# Patient Record
Sex: Male | Born: 1970 | Race: White | Hispanic: No | Marital: Married | Smoking: Never smoker
Health system: Southern US, Community
[De-identification: ages and names within clinical notes are randomized; demographics above are authoritative.]

## PROBLEM LIST (undated history)

## (undated) DIAGNOSIS — J45909 Unspecified asthma, uncomplicated: Secondary | ICD-10-CM

## (undated) DIAGNOSIS — R0789 Other chest pain: Secondary | ICD-10-CM

## (undated) DIAGNOSIS — E042 Nontoxic multinodular goiter: Secondary | ICD-10-CM

## (undated) DIAGNOSIS — E0590 Thyrotoxicosis, unspecified without thyrotoxic crisis or storm: Secondary | ICD-10-CM

## (undated) DIAGNOSIS — R7303 Prediabetes: Secondary | ICD-10-CM

## (undated) HISTORY — PX: LIGAMENT REPAIR: SHX5444

## (undated) HISTORY — DX: Prediabetes: R73.03

## (undated) HISTORY — DX: Unspecified asthma, uncomplicated: J45.909

## (undated) HISTORY — PX: BROSTROM PROCEDURE: SHX1270

## (undated) HISTORY — DX: Nontoxic multinodular goiter: E04.2

## (undated) HISTORY — DX: Other chest pain: R07.89

## (undated) HISTORY — DX: Thyrotoxicosis, unspecified without thyrotoxic crisis or storm: E05.90

---

## 2008-03-04 ENCOUNTER — Encounter: Admission: RE | Admit: 2008-03-04 | Discharge: 2008-03-04 | Payer: Self-pay | Admitting: Sports Medicine

## 2016-08-20 DIAGNOSIS — J3081 Allergic rhinitis due to animal (cat) (dog) hair and dander: Secondary | ICD-10-CM | POA: Diagnosis not present

## 2016-08-20 DIAGNOSIS — J301 Allergic rhinitis due to pollen: Secondary | ICD-10-CM | POA: Diagnosis not present

## 2016-08-20 DIAGNOSIS — J3089 Other allergic rhinitis: Secondary | ICD-10-CM | POA: Diagnosis not present

## 2016-09-03 DIAGNOSIS — J301 Allergic rhinitis due to pollen: Secondary | ICD-10-CM | POA: Diagnosis not present

## 2016-09-03 DIAGNOSIS — J3081 Allergic rhinitis due to animal (cat) (dog) hair and dander: Secondary | ICD-10-CM | POA: Diagnosis not present

## 2016-09-03 DIAGNOSIS — J3089 Other allergic rhinitis: Secondary | ICD-10-CM | POA: Diagnosis not present

## 2016-09-10 DIAGNOSIS — Z0000 Encounter for general adult medical examination without abnormal findings: Secondary | ICD-10-CM | POA: Diagnosis not present

## 2016-09-10 DIAGNOSIS — Z131 Encounter for screening for diabetes mellitus: Secondary | ICD-10-CM | POA: Diagnosis not present

## 2016-09-10 DIAGNOSIS — Z13220 Encounter for screening for lipoid disorders: Secondary | ICD-10-CM | POA: Diagnosis not present

## 2016-09-17 DIAGNOSIS — J3089 Other allergic rhinitis: Secondary | ICD-10-CM | POA: Diagnosis not present

## 2016-09-17 DIAGNOSIS — J301 Allergic rhinitis due to pollen: Secondary | ICD-10-CM | POA: Diagnosis not present

## 2016-09-17 DIAGNOSIS — J3081 Allergic rhinitis due to animal (cat) (dog) hair and dander: Secondary | ICD-10-CM | POA: Diagnosis not present

## 2016-10-04 DIAGNOSIS — J3081 Allergic rhinitis due to animal (cat) (dog) hair and dander: Secondary | ICD-10-CM | POA: Diagnosis not present

## 2016-10-04 DIAGNOSIS — J3089 Other allergic rhinitis: Secondary | ICD-10-CM | POA: Diagnosis not present

## 2016-10-04 DIAGNOSIS — J301 Allergic rhinitis due to pollen: Secondary | ICD-10-CM | POA: Diagnosis not present

## 2016-10-29 DIAGNOSIS — J301 Allergic rhinitis due to pollen: Secondary | ICD-10-CM | POA: Diagnosis not present

## 2016-10-29 DIAGNOSIS — J3081 Allergic rhinitis due to animal (cat) (dog) hair and dander: Secondary | ICD-10-CM | POA: Diagnosis not present

## 2016-10-29 DIAGNOSIS — J3089 Other allergic rhinitis: Secondary | ICD-10-CM | POA: Diagnosis not present

## 2016-11-14 DIAGNOSIS — J3081 Allergic rhinitis due to animal (cat) (dog) hair and dander: Secondary | ICD-10-CM | POA: Diagnosis not present

## 2016-11-14 DIAGNOSIS — J3089 Other allergic rhinitis: Secondary | ICD-10-CM | POA: Diagnosis not present

## 2016-11-14 DIAGNOSIS — J301 Allergic rhinitis due to pollen: Secondary | ICD-10-CM | POA: Diagnosis not present

## 2016-11-26 DIAGNOSIS — J3081 Allergic rhinitis due to animal (cat) (dog) hair and dander: Secondary | ICD-10-CM | POA: Diagnosis not present

## 2016-11-26 DIAGNOSIS — J3089 Other allergic rhinitis: Secondary | ICD-10-CM | POA: Diagnosis not present

## 2016-11-26 DIAGNOSIS — J301 Allergic rhinitis due to pollen: Secondary | ICD-10-CM | POA: Diagnosis not present

## 2016-12-10 DIAGNOSIS — J3081 Allergic rhinitis due to animal (cat) (dog) hair and dander: Secondary | ICD-10-CM | POA: Diagnosis not present

## 2016-12-10 DIAGNOSIS — J3089 Other allergic rhinitis: Secondary | ICD-10-CM | POA: Diagnosis not present

## 2016-12-10 DIAGNOSIS — J301 Allergic rhinitis due to pollen: Secondary | ICD-10-CM | POA: Diagnosis not present

## 2016-12-18 DIAGNOSIS — J3081 Allergic rhinitis due to animal (cat) (dog) hair and dander: Secondary | ICD-10-CM | POA: Diagnosis not present

## 2016-12-18 DIAGNOSIS — J301 Allergic rhinitis due to pollen: Secondary | ICD-10-CM | POA: Diagnosis not present

## 2016-12-18 DIAGNOSIS — J3089 Other allergic rhinitis: Secondary | ICD-10-CM | POA: Diagnosis not present

## 2016-12-24 DIAGNOSIS — J301 Allergic rhinitis due to pollen: Secondary | ICD-10-CM | POA: Diagnosis not present

## 2016-12-24 DIAGNOSIS — J3081 Allergic rhinitis due to animal (cat) (dog) hair and dander: Secondary | ICD-10-CM | POA: Diagnosis not present

## 2016-12-24 DIAGNOSIS — J3089 Other allergic rhinitis: Secondary | ICD-10-CM | POA: Diagnosis not present

## 2017-01-09 DIAGNOSIS — J301 Allergic rhinitis due to pollen: Secondary | ICD-10-CM | POA: Diagnosis not present

## 2017-01-09 DIAGNOSIS — J3081 Allergic rhinitis due to animal (cat) (dog) hair and dander: Secondary | ICD-10-CM | POA: Diagnosis not present

## 2017-01-09 DIAGNOSIS — J3089 Other allergic rhinitis: Secondary | ICD-10-CM | POA: Diagnosis not present

## 2017-01-21 DIAGNOSIS — J3081 Allergic rhinitis due to animal (cat) (dog) hair and dander: Secondary | ICD-10-CM | POA: Diagnosis not present

## 2017-01-21 DIAGNOSIS — J3089 Other allergic rhinitis: Secondary | ICD-10-CM | POA: Diagnosis not present

## 2017-01-21 DIAGNOSIS — J301 Allergic rhinitis due to pollen: Secondary | ICD-10-CM | POA: Diagnosis not present

## 2017-01-26 DIAGNOSIS — A0839 Other viral enteritis: Secondary | ICD-10-CM | POA: Diagnosis not present

## 2017-02-04 DIAGNOSIS — J3089 Other allergic rhinitis: Secondary | ICD-10-CM | POA: Diagnosis not present

## 2017-02-04 DIAGNOSIS — J301 Allergic rhinitis due to pollen: Secondary | ICD-10-CM | POA: Diagnosis not present

## 2017-02-04 DIAGNOSIS — J3081 Allergic rhinitis due to animal (cat) (dog) hair and dander: Secondary | ICD-10-CM | POA: Diagnosis not present

## 2017-02-18 DIAGNOSIS — J301 Allergic rhinitis due to pollen: Secondary | ICD-10-CM | POA: Diagnosis not present

## 2017-02-18 DIAGNOSIS — J3081 Allergic rhinitis due to animal (cat) (dog) hair and dander: Secondary | ICD-10-CM | POA: Diagnosis not present

## 2017-02-18 DIAGNOSIS — J3089 Other allergic rhinitis: Secondary | ICD-10-CM | POA: Diagnosis not present

## 2017-03-11 DIAGNOSIS — J3081 Allergic rhinitis due to animal (cat) (dog) hair and dander: Secondary | ICD-10-CM | POA: Diagnosis not present

## 2017-03-11 DIAGNOSIS — J3089 Other allergic rhinitis: Secondary | ICD-10-CM | POA: Diagnosis not present

## 2017-03-11 DIAGNOSIS — J301 Allergic rhinitis due to pollen: Secondary | ICD-10-CM | POA: Diagnosis not present

## 2017-03-25 DIAGNOSIS — J301 Allergic rhinitis due to pollen: Secondary | ICD-10-CM | POA: Diagnosis not present

## 2017-03-25 DIAGNOSIS — J3081 Allergic rhinitis due to animal (cat) (dog) hair and dander: Secondary | ICD-10-CM | POA: Diagnosis not present

## 2017-03-25 DIAGNOSIS — J3089 Other allergic rhinitis: Secondary | ICD-10-CM | POA: Diagnosis not present

## 2017-04-16 DIAGNOSIS — J3081 Allergic rhinitis due to animal (cat) (dog) hair and dander: Secondary | ICD-10-CM | POA: Diagnosis not present

## 2017-04-16 DIAGNOSIS — J301 Allergic rhinitis due to pollen: Secondary | ICD-10-CM | POA: Diagnosis not present

## 2017-04-16 DIAGNOSIS — J3089 Other allergic rhinitis: Secondary | ICD-10-CM | POA: Diagnosis not present

## 2017-04-29 DIAGNOSIS — J301 Allergic rhinitis due to pollen: Secondary | ICD-10-CM | POA: Diagnosis not present

## 2017-04-29 DIAGNOSIS — J3081 Allergic rhinitis due to animal (cat) (dog) hair and dander: Secondary | ICD-10-CM | POA: Diagnosis not present

## 2017-04-29 DIAGNOSIS — J3089 Other allergic rhinitis: Secondary | ICD-10-CM | POA: Diagnosis not present

## 2017-05-13 DIAGNOSIS — J3089 Other allergic rhinitis: Secondary | ICD-10-CM | POA: Diagnosis not present

## 2017-05-13 DIAGNOSIS — Z23 Encounter for immunization: Secondary | ICD-10-CM | POA: Diagnosis not present

## 2017-05-13 DIAGNOSIS — R599 Enlarged lymph nodes, unspecified: Secondary | ICD-10-CM | POA: Diagnosis not present

## 2017-05-13 DIAGNOSIS — L237 Allergic contact dermatitis due to plants, except food: Secondary | ICD-10-CM | POA: Diagnosis not present

## 2017-05-13 DIAGNOSIS — J3081 Allergic rhinitis due to animal (cat) (dog) hair and dander: Secondary | ICD-10-CM | POA: Diagnosis not present

## 2017-05-13 DIAGNOSIS — J301 Allergic rhinitis due to pollen: Secondary | ICD-10-CM | POA: Diagnosis not present

## 2017-05-17 ENCOUNTER — Other Ambulatory Visit: Payer: Self-pay | Admitting: Physician Assistant

## 2017-05-17 DIAGNOSIS — E0590 Thyrotoxicosis, unspecified without thyrotoxic crisis or storm: Secondary | ICD-10-CM | POA: Diagnosis not present

## 2017-05-17 DIAGNOSIS — R599 Enlarged lymph nodes, unspecified: Secondary | ICD-10-CM

## 2017-05-20 ENCOUNTER — Ambulatory Visit
Admission: RE | Admit: 2017-05-20 | Discharge: 2017-05-20 | Disposition: A | Discharge status: Home / Self Care | Payer: 59 | Source: Ambulatory Visit | Attending: Physician Assistant | Admitting: Physician Assistant

## 2017-05-20 DIAGNOSIS — R599 Enlarged lymph nodes, unspecified: Secondary | ICD-10-CM

## 2017-05-20 DIAGNOSIS — E042 Nontoxic multinodular goiter: Secondary | ICD-10-CM | POA: Diagnosis not present

## 2017-05-23 ENCOUNTER — Other Ambulatory Visit: Payer: Self-pay | Admitting: Physician Assistant

## 2017-05-23 DIAGNOSIS — R599 Enlarged lymph nodes, unspecified: Secondary | ICD-10-CM

## 2017-05-27 DIAGNOSIS — J3081 Allergic rhinitis due to animal (cat) (dog) hair and dander: Secondary | ICD-10-CM | POA: Diagnosis not present

## 2017-05-27 DIAGNOSIS — J301 Allergic rhinitis due to pollen: Secondary | ICD-10-CM | POA: Diagnosis not present

## 2017-05-27 DIAGNOSIS — J3089 Other allergic rhinitis: Secondary | ICD-10-CM | POA: Diagnosis not present

## 2017-05-28 DIAGNOSIS — J3081 Allergic rhinitis due to animal (cat) (dog) hair and dander: Secondary | ICD-10-CM | POA: Diagnosis not present

## 2017-05-28 DIAGNOSIS — J301 Allergic rhinitis due to pollen: Secondary | ICD-10-CM | POA: Diagnosis not present

## 2017-05-28 DIAGNOSIS — J3089 Other allergic rhinitis: Secondary | ICD-10-CM | POA: Diagnosis not present

## 2017-06-05 ENCOUNTER — Ambulatory Visit
Admission: RE | Admit: 2017-06-05 | Discharge: 2017-06-05 | Disposition: A | Discharge status: Home / Self Care | Payer: 59 | Source: Ambulatory Visit | Attending: Physician Assistant | Admitting: Physician Assistant

## 2017-06-05 ENCOUNTER — Other Ambulatory Visit: Payer: Self-pay | Admitting: Physician Assistant

## 2017-06-05 ENCOUNTER — Other Ambulatory Visit (HOSPITAL_COMMUNITY)
Admission: RE | Admit: 2017-06-05 | Discharge: 2017-06-05 | Disposition: A | Discharge status: Home / Self Care | Payer: 59 | Source: Ambulatory Visit | Attending: Radiology | Admitting: Radiology

## 2017-06-05 DIAGNOSIS — E042 Nontoxic multinodular goiter: Secondary | ICD-10-CM | POA: Diagnosis not present

## 2017-06-05 DIAGNOSIS — E041 Nontoxic single thyroid nodule: Secondary | ICD-10-CM

## 2017-06-05 DIAGNOSIS — R599 Enlarged lymph nodes, unspecified: Secondary | ICD-10-CM

## 2017-06-11 DIAGNOSIS — J3081 Allergic rhinitis due to animal (cat) (dog) hair and dander: Secondary | ICD-10-CM | POA: Diagnosis not present

## 2017-06-11 DIAGNOSIS — J3089 Other allergic rhinitis: Secondary | ICD-10-CM | POA: Diagnosis not present

## 2017-06-11 DIAGNOSIS — J301 Allergic rhinitis due to pollen: Secondary | ICD-10-CM | POA: Diagnosis not present

## 2017-06-24 DIAGNOSIS — J3089 Other allergic rhinitis: Secondary | ICD-10-CM | POA: Diagnosis not present

## 2017-06-24 DIAGNOSIS — J301 Allergic rhinitis due to pollen: Secondary | ICD-10-CM | POA: Diagnosis not present

## 2017-06-24 DIAGNOSIS — J3081 Allergic rhinitis due to animal (cat) (dog) hair and dander: Secondary | ICD-10-CM | POA: Diagnosis not present

## 2017-07-01 DIAGNOSIS — J301 Allergic rhinitis due to pollen: Secondary | ICD-10-CM | POA: Diagnosis not present

## 2017-07-01 DIAGNOSIS — J3089 Other allergic rhinitis: Secondary | ICD-10-CM | POA: Diagnosis not present

## 2017-07-01 DIAGNOSIS — J3081 Allergic rhinitis due to animal (cat) (dog) hair and dander: Secondary | ICD-10-CM | POA: Diagnosis not present

## 2017-07-02 DIAGNOSIS — E042 Nontoxic multinodular goiter: Secondary | ICD-10-CM | POA: Diagnosis not present

## 2017-07-02 DIAGNOSIS — R946 Abnormal results of thyroid function studies: Secondary | ICD-10-CM | POA: Diagnosis not present

## 2017-07-02 DIAGNOSIS — R9389 Abnormal findings on diagnostic imaging of other specified body structures: Secondary | ICD-10-CM | POA: Diagnosis not present

## 2017-07-02 DIAGNOSIS — Z808 Family history of malignant neoplasm of other organs or systems: Secondary | ICD-10-CM | POA: Diagnosis not present

## 2017-07-15 DIAGNOSIS — J3089 Other allergic rhinitis: Secondary | ICD-10-CM | POA: Diagnosis not present

## 2017-07-15 DIAGNOSIS — J301 Allergic rhinitis due to pollen: Secondary | ICD-10-CM | POA: Diagnosis not present

## 2017-07-15 DIAGNOSIS — J3081 Allergic rhinitis due to animal (cat) (dog) hair and dander: Secondary | ICD-10-CM | POA: Diagnosis not present

## 2017-07-29 DIAGNOSIS — J301 Allergic rhinitis due to pollen: Secondary | ICD-10-CM | POA: Diagnosis not present

## 2017-07-29 DIAGNOSIS — J3089 Other allergic rhinitis: Secondary | ICD-10-CM | POA: Diagnosis not present

## 2017-07-29 DIAGNOSIS — J3081 Allergic rhinitis due to animal (cat) (dog) hair and dander: Secondary | ICD-10-CM | POA: Diagnosis not present

## 2017-08-19 DIAGNOSIS — J3089 Other allergic rhinitis: Secondary | ICD-10-CM | POA: Diagnosis not present

## 2017-08-19 DIAGNOSIS — J3081 Allergic rhinitis due to animal (cat) (dog) hair and dander: Secondary | ICD-10-CM | POA: Diagnosis not present

## 2017-08-19 DIAGNOSIS — J301 Allergic rhinitis due to pollen: Secondary | ICD-10-CM | POA: Diagnosis not present

## 2017-09-09 DIAGNOSIS — J3089 Other allergic rhinitis: Secondary | ICD-10-CM | POA: Diagnosis not present

## 2017-09-09 DIAGNOSIS — J3081 Allergic rhinitis due to animal (cat) (dog) hair and dander: Secondary | ICD-10-CM | POA: Diagnosis not present

## 2017-09-09 DIAGNOSIS — J301 Allergic rhinitis due to pollen: Secondary | ICD-10-CM | POA: Diagnosis not present

## 2017-09-18 DIAGNOSIS — Z0000 Encounter for general adult medical examination without abnormal findings: Secondary | ICD-10-CM | POA: Diagnosis not present

## 2017-09-18 DIAGNOSIS — E7800 Pure hypercholesterolemia, unspecified: Secondary | ICD-10-CM | POA: Diagnosis not present

## 2017-09-18 DIAGNOSIS — Z23 Encounter for immunization: Secondary | ICD-10-CM | POA: Diagnosis not present

## 2017-09-30 DIAGNOSIS — J301 Allergic rhinitis due to pollen: Secondary | ICD-10-CM | POA: Diagnosis not present

## 2017-09-30 DIAGNOSIS — J3081 Allergic rhinitis due to animal (cat) (dog) hair and dander: Secondary | ICD-10-CM | POA: Diagnosis not present

## 2017-09-30 DIAGNOSIS — J3089 Other allergic rhinitis: Secondary | ICD-10-CM | POA: Diagnosis not present

## 2017-10-21 DIAGNOSIS — J301 Allergic rhinitis due to pollen: Secondary | ICD-10-CM | POA: Diagnosis not present

## 2017-10-21 DIAGNOSIS — J3089 Other allergic rhinitis: Secondary | ICD-10-CM | POA: Diagnosis not present

## 2017-10-21 DIAGNOSIS — J3081 Allergic rhinitis due to animal (cat) (dog) hair and dander: Secondary | ICD-10-CM | POA: Diagnosis not present

## 2017-10-22 DIAGNOSIS — J3081 Allergic rhinitis due to animal (cat) (dog) hair and dander: Secondary | ICD-10-CM | POA: Diagnosis not present

## 2017-10-22 DIAGNOSIS — J301 Allergic rhinitis due to pollen: Secondary | ICD-10-CM | POA: Diagnosis not present

## 2017-10-22 DIAGNOSIS — J3089 Other allergic rhinitis: Secondary | ICD-10-CM | POA: Diagnosis not present

## 2017-11-19 DIAGNOSIS — J3081 Allergic rhinitis due to animal (cat) (dog) hair and dander: Secondary | ICD-10-CM | POA: Diagnosis not present

## 2017-11-19 DIAGNOSIS — J301 Allergic rhinitis due to pollen: Secondary | ICD-10-CM | POA: Diagnosis not present

## 2017-11-19 DIAGNOSIS — J3089 Other allergic rhinitis: Secondary | ICD-10-CM | POA: Diagnosis not present

## 2017-12-11 DIAGNOSIS — J301 Allergic rhinitis due to pollen: Secondary | ICD-10-CM | POA: Diagnosis not present

## 2017-12-11 DIAGNOSIS — J3089 Other allergic rhinitis: Secondary | ICD-10-CM | POA: Diagnosis not present

## 2017-12-11 DIAGNOSIS — J3081 Allergic rhinitis due to animal (cat) (dog) hair and dander: Secondary | ICD-10-CM | POA: Diagnosis not present

## 2017-12-18 ENCOUNTER — Other Ambulatory Visit: Payer: Self-pay | Admitting: Internal Medicine

## 2017-12-18 DIAGNOSIS — E042 Nontoxic multinodular goiter: Secondary | ICD-10-CM

## 2017-12-18 DIAGNOSIS — Z808 Family history of malignant neoplasm of other organs or systems: Secondary | ICD-10-CM | POA: Diagnosis not present

## 2017-12-18 DIAGNOSIS — R946 Abnormal results of thyroid function studies: Secondary | ICD-10-CM | POA: Diagnosis not present

## 2017-12-30 DIAGNOSIS — J301 Allergic rhinitis due to pollen: Secondary | ICD-10-CM | POA: Diagnosis not present

## 2017-12-30 DIAGNOSIS — J3089 Other allergic rhinitis: Secondary | ICD-10-CM | POA: Diagnosis not present

## 2017-12-30 DIAGNOSIS — J3081 Allergic rhinitis due to animal (cat) (dog) hair and dander: Secondary | ICD-10-CM | POA: Diagnosis not present

## 2018-01-27 DIAGNOSIS — J3081 Allergic rhinitis due to animal (cat) (dog) hair and dander: Secondary | ICD-10-CM | POA: Diagnosis not present

## 2018-01-27 DIAGNOSIS — J3089 Other allergic rhinitis: Secondary | ICD-10-CM | POA: Diagnosis not present

## 2018-01-27 DIAGNOSIS — J301 Allergic rhinitis due to pollen: Secondary | ICD-10-CM | POA: Diagnosis not present

## 2018-02-24 DIAGNOSIS — J301 Allergic rhinitis due to pollen: Secondary | ICD-10-CM | POA: Diagnosis not present

## 2018-02-24 DIAGNOSIS — J3081 Allergic rhinitis due to animal (cat) (dog) hair and dander: Secondary | ICD-10-CM | POA: Diagnosis not present

## 2018-02-24 DIAGNOSIS — J3089 Other allergic rhinitis: Secondary | ICD-10-CM | POA: Diagnosis not present

## 2018-03-31 DIAGNOSIS — J301 Allergic rhinitis due to pollen: Secondary | ICD-10-CM | POA: Diagnosis not present

## 2018-03-31 DIAGNOSIS — J3081 Allergic rhinitis due to animal (cat) (dog) hair and dander: Secondary | ICD-10-CM | POA: Diagnosis not present

## 2018-03-31 DIAGNOSIS — J3089 Other allergic rhinitis: Secondary | ICD-10-CM | POA: Diagnosis not present

## 2018-04-23 DIAGNOSIS — J3081 Allergic rhinitis due to animal (cat) (dog) hair and dander: Secondary | ICD-10-CM | POA: Diagnosis not present

## 2018-04-23 DIAGNOSIS — J301 Allergic rhinitis due to pollen: Secondary | ICD-10-CM | POA: Diagnosis not present

## 2018-04-23 DIAGNOSIS — J3089 Other allergic rhinitis: Secondary | ICD-10-CM | POA: Diagnosis not present

## 2018-05-14 ENCOUNTER — Other Ambulatory Visit: Payer: 59

## 2018-05-16 ENCOUNTER — Ambulatory Visit
Admission: RE | Admit: 2018-05-16 | Discharge: 2018-05-16 | Disposition: A | Discharge status: Home / Self Care | Payer: 59 | Source: Ambulatory Visit | Attending: Internal Medicine | Admitting: Internal Medicine

## 2018-05-16 DIAGNOSIS — E042 Nontoxic multinodular goiter: Secondary | ICD-10-CM

## 2018-05-26 DIAGNOSIS — J3081 Allergic rhinitis due to animal (cat) (dog) hair and dander: Secondary | ICD-10-CM | POA: Diagnosis not present

## 2018-05-26 DIAGNOSIS — J301 Allergic rhinitis due to pollen: Secondary | ICD-10-CM | POA: Diagnosis not present

## 2018-05-26 DIAGNOSIS — J3089 Other allergic rhinitis: Secondary | ICD-10-CM | POA: Diagnosis not present

## 2018-06-16 DIAGNOSIS — E042 Nontoxic multinodular goiter: Secondary | ICD-10-CM | POA: Diagnosis not present

## 2018-06-18 ENCOUNTER — Other Ambulatory Visit (HOSPITAL_COMMUNITY): Payer: Self-pay | Admitting: Internal Medicine

## 2018-06-18 DIAGNOSIS — R946 Abnormal results of thyroid function studies: Secondary | ICD-10-CM | POA: Diagnosis not present

## 2018-06-18 DIAGNOSIS — R9389 Abnormal findings on diagnostic imaging of other specified body structures: Secondary | ICD-10-CM | POA: Diagnosis not present

## 2018-06-18 DIAGNOSIS — E042 Nontoxic multinodular goiter: Secondary | ICD-10-CM | POA: Diagnosis not present

## 2018-06-23 DIAGNOSIS — J3081 Allergic rhinitis due to animal (cat) (dog) hair and dander: Secondary | ICD-10-CM | POA: Diagnosis not present

## 2018-06-23 DIAGNOSIS — J301 Allergic rhinitis due to pollen: Secondary | ICD-10-CM | POA: Diagnosis not present

## 2018-06-23 DIAGNOSIS — J3089 Other allergic rhinitis: Secondary | ICD-10-CM | POA: Diagnosis not present

## 2018-06-27 DIAGNOSIS — E042 Nontoxic multinodular goiter: Secondary | ICD-10-CM | POA: Diagnosis not present

## 2018-07-01 ENCOUNTER — Encounter (HOSPITAL_COMMUNITY): Payer: 59

## 2018-07-02 ENCOUNTER — Encounter (HOSPITAL_COMMUNITY): Payer: 59

## 2018-07-07 ENCOUNTER — Encounter (HOSPITAL_COMMUNITY)
Admission: RE | Admit: 2018-07-07 | Discharge: 2018-07-07 | Disposition: A | Discharge status: Home / Self Care | Payer: 59 | Source: Ambulatory Visit | Attending: Internal Medicine | Admitting: Internal Medicine

## 2018-07-07 DIAGNOSIS — E042 Nontoxic multinodular goiter: Secondary | ICD-10-CM | POA: Diagnosis not present

## 2018-07-07 MED ORDER — SODIUM IODIDE I-123 7.4 MBQ CAPS
466.0000 | ORAL_CAPSULE | Freq: Once | ORAL | Status: AC
  Administered 2018-07-07: 466 via ORAL

## 2018-07-08 ENCOUNTER — Encounter (HOSPITAL_COMMUNITY)
Admission: RE | Admit: 2018-07-08 | Discharge: 2018-07-08 | Disposition: A | Discharge status: Home / Self Care | Payer: 59 | Source: Ambulatory Visit | Attending: Internal Medicine | Admitting: Internal Medicine

## 2018-07-08 DIAGNOSIS — E0590 Thyrotoxicosis, unspecified without thyrotoxic crisis or storm: Secondary | ICD-10-CM | POA: Diagnosis not present

## 2018-07-21 DIAGNOSIS — J3081 Allergic rhinitis due to animal (cat) (dog) hair and dander: Secondary | ICD-10-CM | POA: Diagnosis not present

## 2018-07-21 DIAGNOSIS — J301 Allergic rhinitis due to pollen: Secondary | ICD-10-CM | POA: Diagnosis not present

## 2018-07-21 DIAGNOSIS — J3089 Other allergic rhinitis: Secondary | ICD-10-CM | POA: Diagnosis not present

## 2018-08-18 DIAGNOSIS — J3081 Allergic rhinitis due to animal (cat) (dog) hair and dander: Secondary | ICD-10-CM | POA: Diagnosis not present

## 2018-08-18 DIAGNOSIS — J3089 Other allergic rhinitis: Secondary | ICD-10-CM | POA: Diagnosis not present

## 2018-08-18 DIAGNOSIS — J301 Allergic rhinitis due to pollen: Secondary | ICD-10-CM | POA: Diagnosis not present

## 2018-09-15 DIAGNOSIS — J3089 Other allergic rhinitis: Secondary | ICD-10-CM | POA: Diagnosis not present

## 2018-09-15 DIAGNOSIS — J3081 Allergic rhinitis due to animal (cat) (dog) hair and dander: Secondary | ICD-10-CM | POA: Diagnosis not present

## 2018-09-15 DIAGNOSIS — J301 Allergic rhinitis due to pollen: Secondary | ICD-10-CM | POA: Diagnosis not present

## 2018-09-21 DIAGNOSIS — J301 Allergic rhinitis due to pollen: Secondary | ICD-10-CM | POA: Diagnosis not present

## 2018-09-21 DIAGNOSIS — J3081 Allergic rhinitis due to animal (cat) (dog) hair and dander: Secondary | ICD-10-CM | POA: Diagnosis not present

## 2018-09-21 DIAGNOSIS — J3089 Other allergic rhinitis: Secondary | ICD-10-CM | POA: Diagnosis not present

## 2018-09-24 DIAGNOSIS — Z136 Encounter for screening for cardiovascular disorders: Secondary | ICD-10-CM | POA: Diagnosis not present

## 2018-09-24 DIAGNOSIS — Z0000 Encounter for general adult medical examination without abnormal findings: Secondary | ICD-10-CM | POA: Diagnosis not present

## 2018-10-03 DIAGNOSIS — R946 Abnormal results of thyroid function studies: Secondary | ICD-10-CM | POA: Diagnosis not present

## 2018-10-03 DIAGNOSIS — Z808 Family history of malignant neoplasm of other organs or systems: Secondary | ICD-10-CM | POA: Diagnosis not present

## 2018-10-03 DIAGNOSIS — E042 Nontoxic multinodular goiter: Secondary | ICD-10-CM | POA: Diagnosis not present

## 2018-10-15 DIAGNOSIS — J3081 Allergic rhinitis due to animal (cat) (dog) hair and dander: Secondary | ICD-10-CM | POA: Diagnosis not present

## 2018-10-15 DIAGNOSIS — J3089 Other allergic rhinitis: Secondary | ICD-10-CM | POA: Diagnosis not present

## 2018-10-15 DIAGNOSIS — J301 Allergic rhinitis due to pollen: Secondary | ICD-10-CM | POA: Diagnosis not present

## 2018-11-11 DIAGNOSIS — J3081 Allergic rhinitis due to animal (cat) (dog) hair and dander: Secondary | ICD-10-CM | POA: Diagnosis not present

## 2018-11-11 DIAGNOSIS — J301 Allergic rhinitis due to pollen: Secondary | ICD-10-CM | POA: Diagnosis not present

## 2018-11-11 DIAGNOSIS — J3089 Other allergic rhinitis: Secondary | ICD-10-CM | POA: Diagnosis not present

## 2018-12-03 DIAGNOSIS — J3081 Allergic rhinitis due to animal (cat) (dog) hair and dander: Secondary | ICD-10-CM | POA: Diagnosis not present

## 2018-12-03 DIAGNOSIS — J3089 Other allergic rhinitis: Secondary | ICD-10-CM | POA: Diagnosis not present

## 2018-12-03 DIAGNOSIS — J301 Allergic rhinitis due to pollen: Secondary | ICD-10-CM | POA: Diagnosis not present

## 2018-12-09 DIAGNOSIS — J3089 Other allergic rhinitis: Secondary | ICD-10-CM | POA: Diagnosis not present

## 2018-12-09 DIAGNOSIS — J301 Allergic rhinitis due to pollen: Secondary | ICD-10-CM | POA: Diagnosis not present

## 2018-12-09 DIAGNOSIS — J3081 Allergic rhinitis due to animal (cat) (dog) hair and dander: Secondary | ICD-10-CM | POA: Diagnosis not present

## 2019-03-04 IMAGING — US US THYROID BIOPSY
1 series · 13 of 20 positions shown · non-contrast
Comparison: US Thyroid 05/20/2017

MEDICATIONS:
10 cc 1% lidocaine

COMPLICATIONS:
None immediate.

INDICATION: Indeterminate thyroid nodule

Right superior nodule; 2.4 cm
Left mid nodule; 1.5 cm
EXAM:
ULTRASOUND GUIDED FINE NEEDLE ASPIRATION OF INDETERMINATE THYROID
NODULE
TECHNIQUE: Informed written consent was obtained from the patient after a
discussion of the risks, benefits and alternatives to treatment.
Questions regarding the procedure were encouraged and answered. A
timeout was performed prior to the initiation of the procedure.

[Series 1: us thyroid biopsy · 0.06mm/px · 20 acquisitions, 13 frames shown]
[im 1/20]
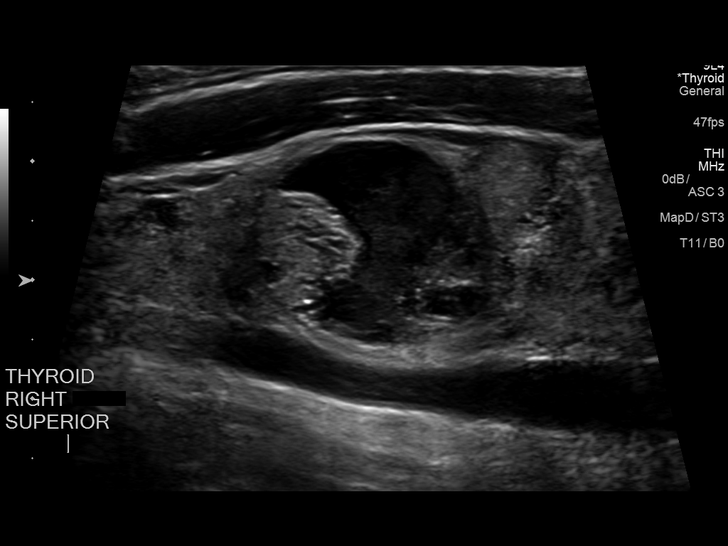
[im 3/20]
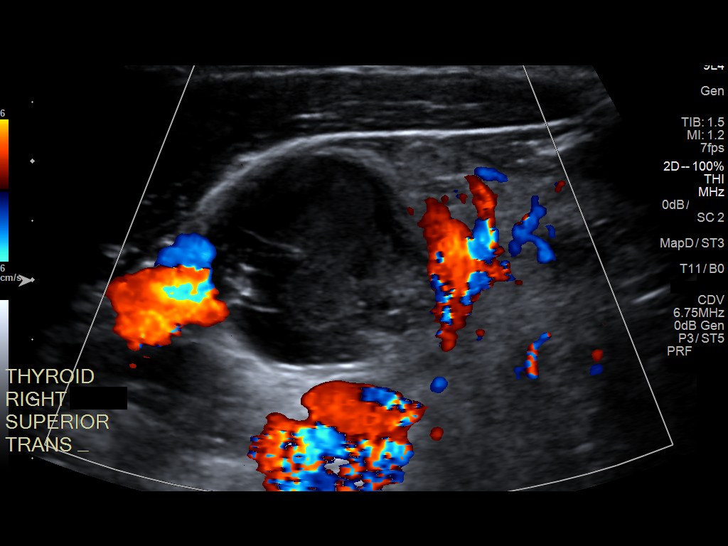
[im 4/20]
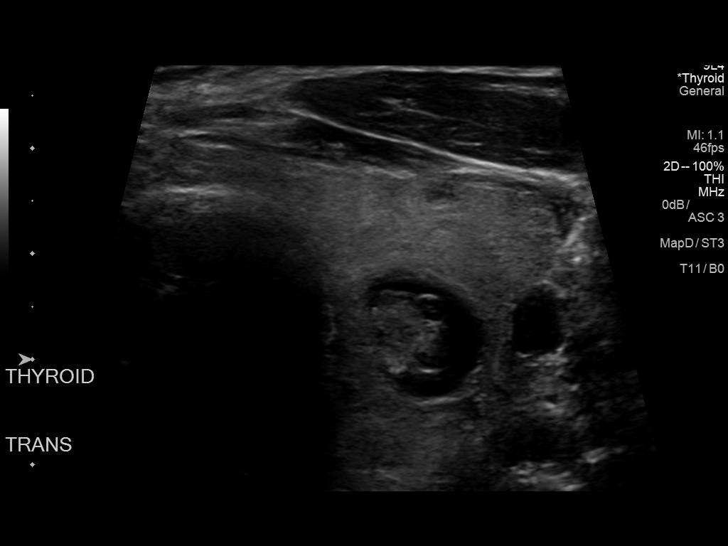
[im 6/20]
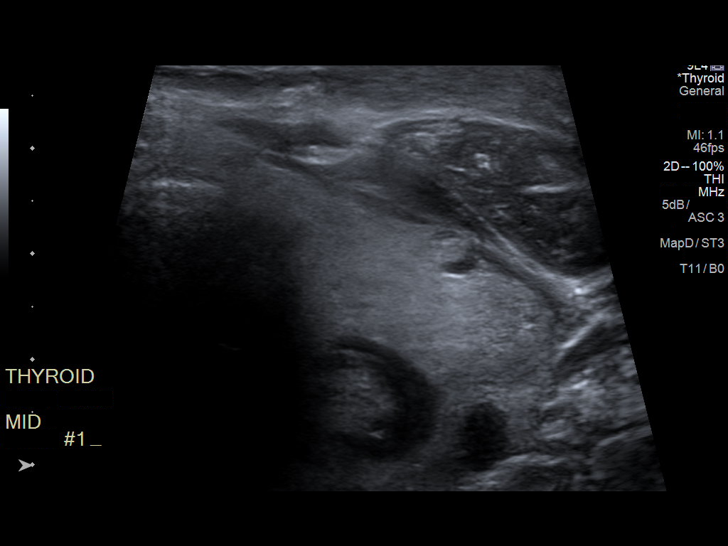
[im 7/20]
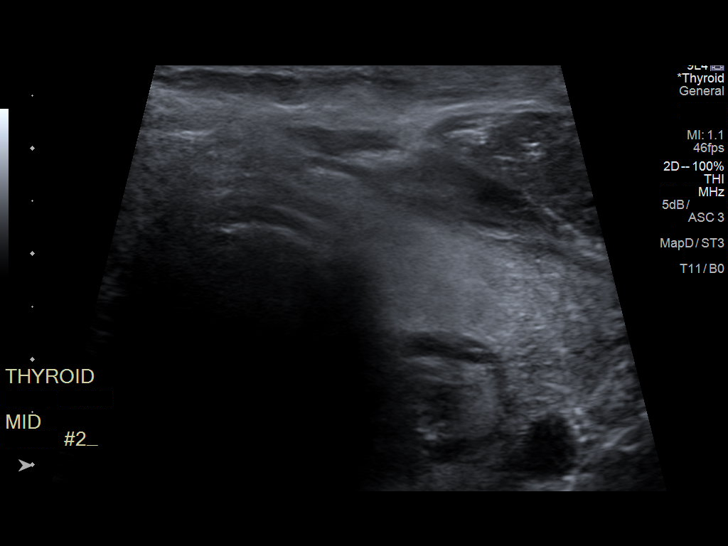
[im 9/20]
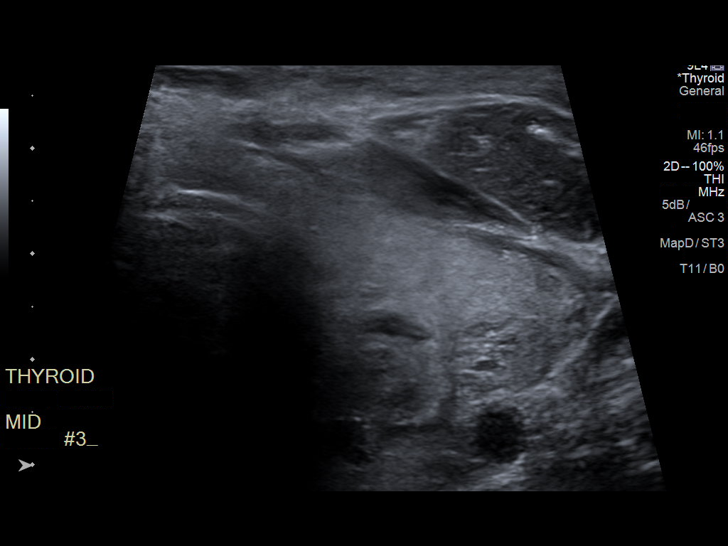
[im 11/20]
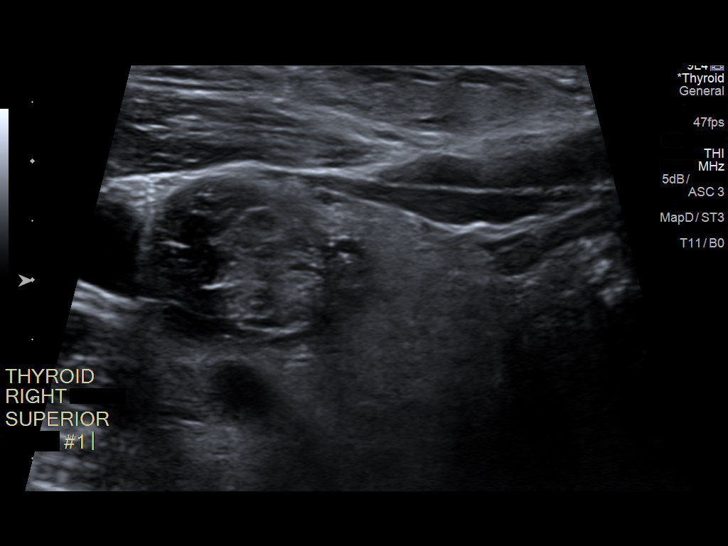
[im 12/20]
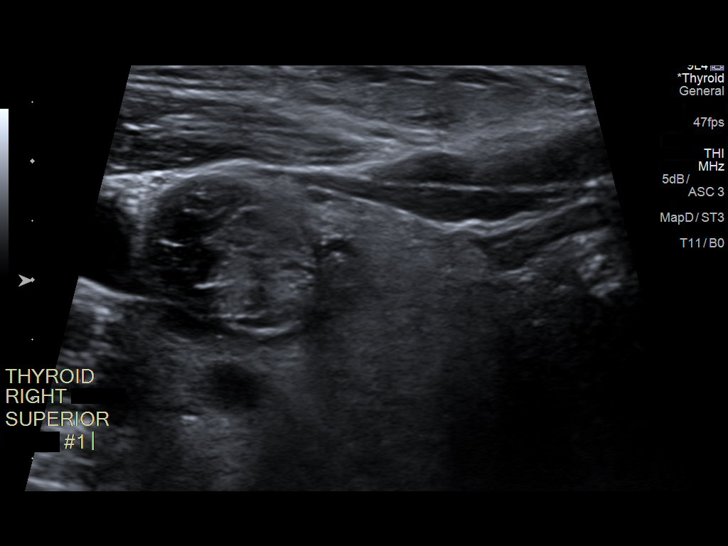
[im 14/20]
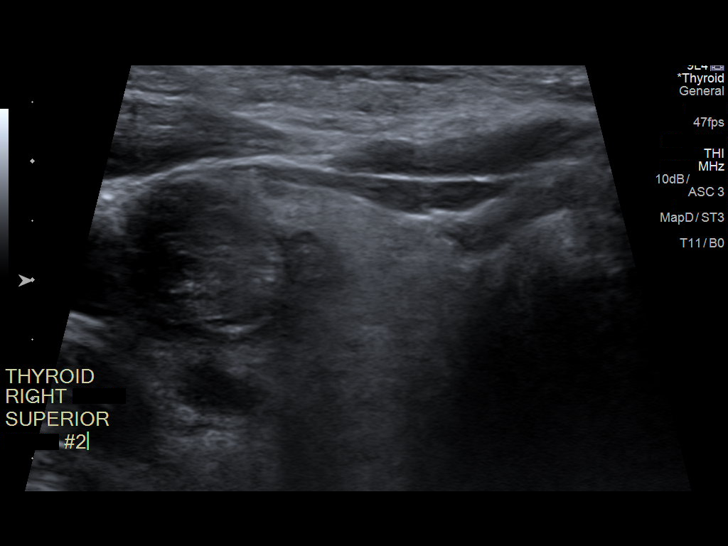
[im 15/20]
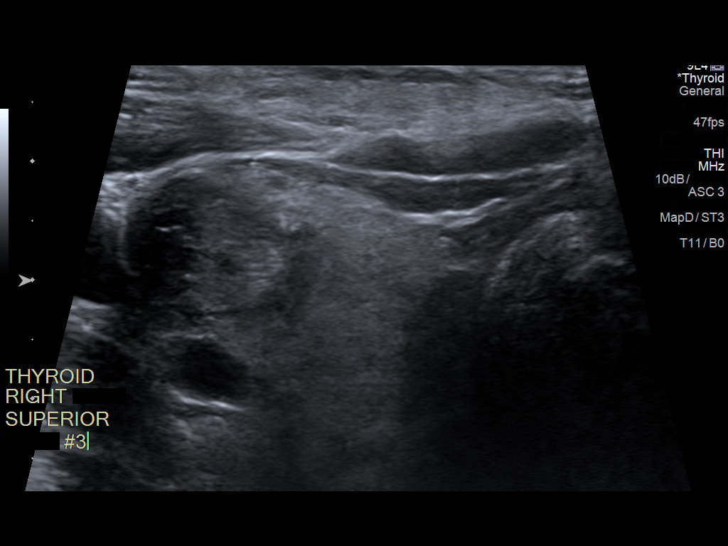
[im 17/20]
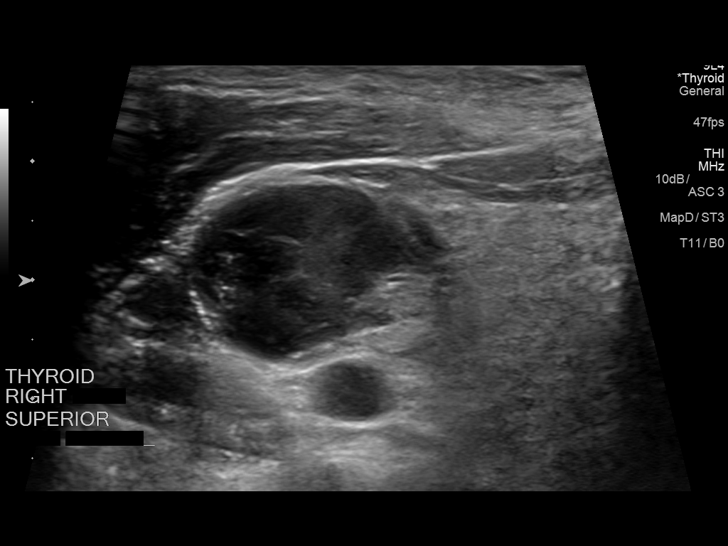
[im 18/20]
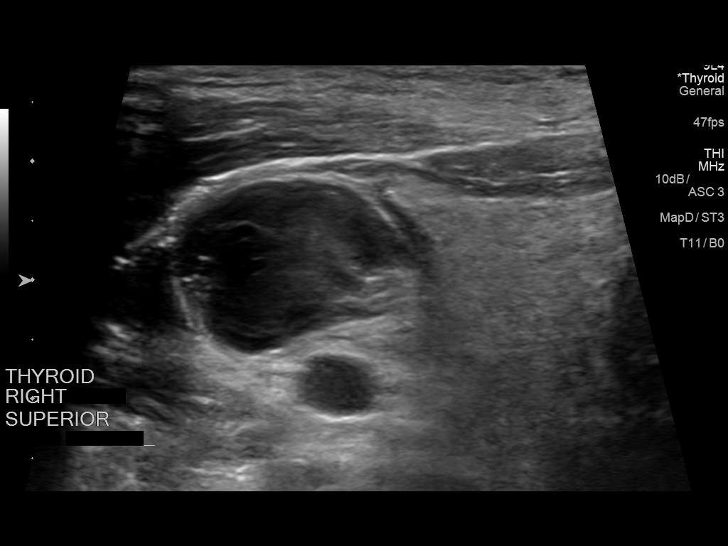
[im 20/20]
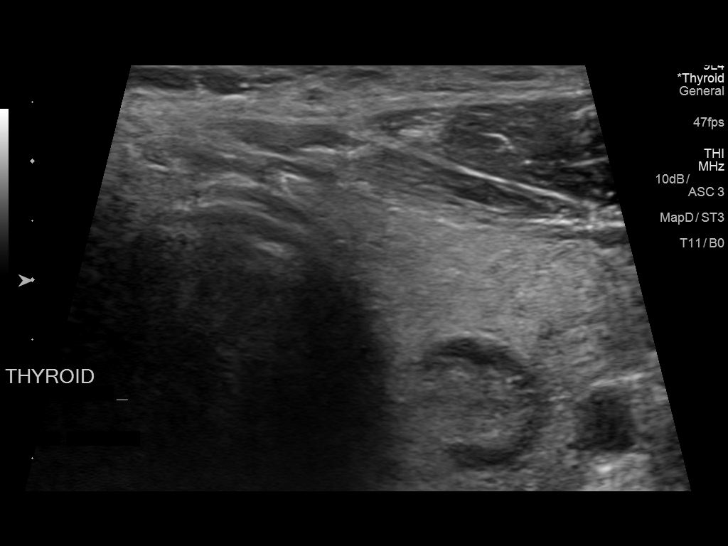

[13 of 20 positions shown; findings below may reference images not displayed]

Pre-procedural ultrasound scanning demonstrated unchanged size and
appearance of the indeterminate nodules within the right and left
thyroid

The procedure was planned. The neck was prepped in the usual sterile
fashion, and a sterile drape was applied covering the operative
field. A timeout was performed prior to the initiation of the
procedure. Local anesthesia was provided with 1% lidocaine.

Under direct ultrasound guidance, 3 FNA biopsies were performed of
the right superior thyroid nodule with a 25 gauge needle. Multiple
ultrasound images were saved for procedural documentation purposes.
The samples were prepared and submitted to pathology.

Under direct ultrasound guidance, 3 FNA biopsies were performed of
the left mid thyroid nodule with a 25 gauge needle. Multiple
ultrasound images were saved for procedural documentation purposes.
The samples were prepared and submitted to pathology.

Limited post procedural scanning was negative for hematoma or
additional complication. Dressings were placed. The patient
tolerated the above procedures procedure well without immediate
postprocedural complication.
FINDINGS: Nodule reference number based on prior diagnostic ultrasound: 1

Maximum size:  2.4 cm

Location: Right; Superior

ACR TI-RADS risk category: TR4 (4-6 points)

Reason for biopsy: meets ACR TI-RADS criteria

_________________________________________________________

Nodule reference number based on prior diagnostic ultrasound: 5

Maximum size:  1.5 cm

Location: Left; Mid

ACR TI-RADS risk category: TR4 (4-6 points)

Reason for biopsy: meets ACR TI-RADS criteria

Ultrasound imaging confirms appropriate placement of the needles
within the thyroid nodule.
IMPRESSION: 1. Technically successful ultrasound guided fine needle aspiration
of right superior thyroid nodule
2. Technically successful ultrasound guided fine needle aspiration
of left mid thyroid nodule

Read by

Viktor Neri

## 2019-12-10 IMAGING — NM NM THYROID IMAGING W/ UPTAKE MULTI (4&24 HR)
4 series · 4 of 4 positions shown · non-contrast
Comparison: Thyroid ultrasound 06/05/2017

CLINICAL DATA: Hyperthyroidism.

EXAM:
THYROID SCAN AND UPTAKE - 4 AND 24 HOURS
TECHNIQUE: Following oral administration of M-2GG capsule, anterior planar
imaging was acquired at 24 hours. Thyroid uptake was calculated with
a thyroid probe at 4-6 hours and 24 hours.
RADIOPHARMACEUTICALS:  Four hundred sixty-six uCi M-2GG sodium
iodide p.o.

[iu thyroid uptake i123 · 3.10mm/px · 1 of 1 slices shown (1 of 4)]
[im 1/1]
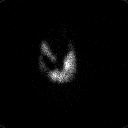

[iu thyroid uptake i123 · 3.10mm/px · 1 of 1 slices shown (2 of 4)]
[im 1/1]
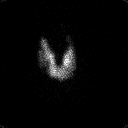

[iu thyroid uptake i123 · 3.10mm/px · 1 of 1 slices shown (3 of 4)]
[im 1/1]
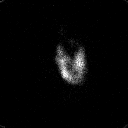

[iu thyroid uptake i123 · 3.10mm/px · 1 of 1 slices shown (4 of 4)]
[im 1/1]
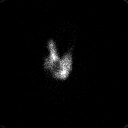

[4 of 4 positions shown; findings below may reference images not displayed]

FINDINGS: There is a large photopenic defect within the right lobe of thyroid
gland corresponding to previously biopsied nodule. No additional
hilar cold nodules identified..

4 hour M-2GG uptake = 8.1% (normal 5-20%)

24 hour M-2GG uptake = 18.4% (normal 10-30%)
IMPRESSION: Normal 4 hour and 24 hour radioactive iodine uptake.

Cold nodule in right lobe of thyroid gland corresponds to recently
biopsied nodule with benign histology.

Please select correct template:

## 2020-02-12 IMAGING — US US THYROID
1 series · 12 of 25 positions shown · non-contrast
Comparison: 05/20/2017

CLINICAL DATA: Goiter. Family history of thyroid cancer. Prior
biopsy.

EXAM:
THYROID ULTRASOUND
TECHNIQUE: Ultrasound examination of the thyroid gland and adjacent soft
tissues was performed.

[Series 1: us thyroid · 0.06mm/px · 91 acquisitions, 12 frames shown]
[im 4/91]
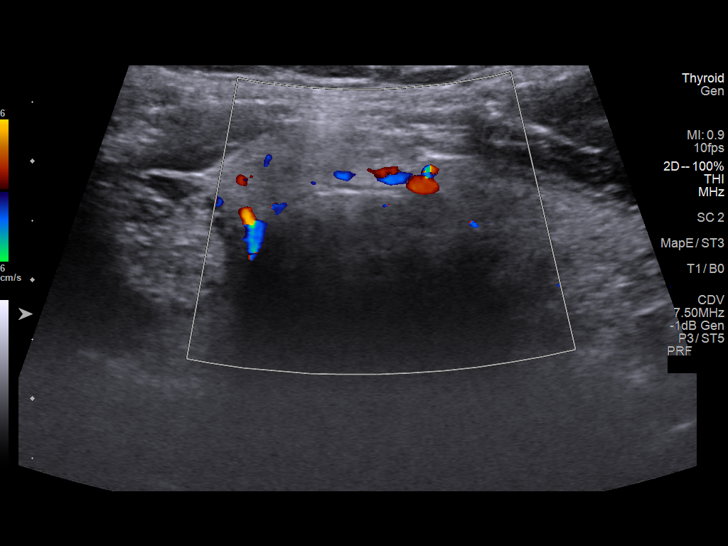
[im 12/91]
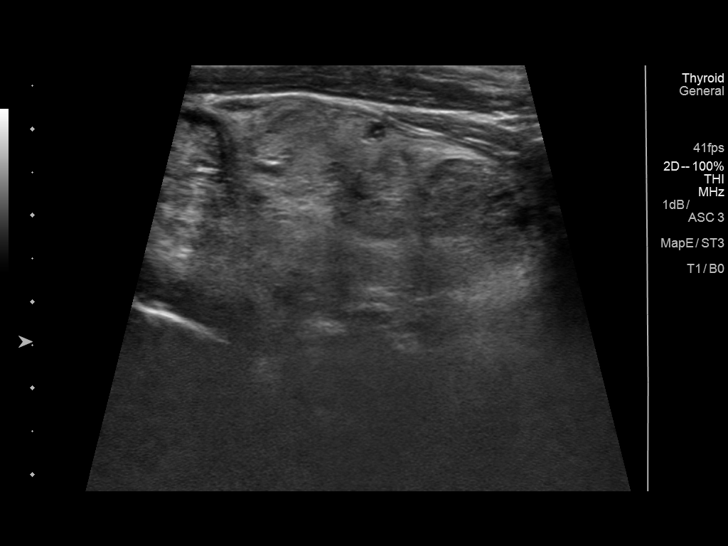
[im 19/91]
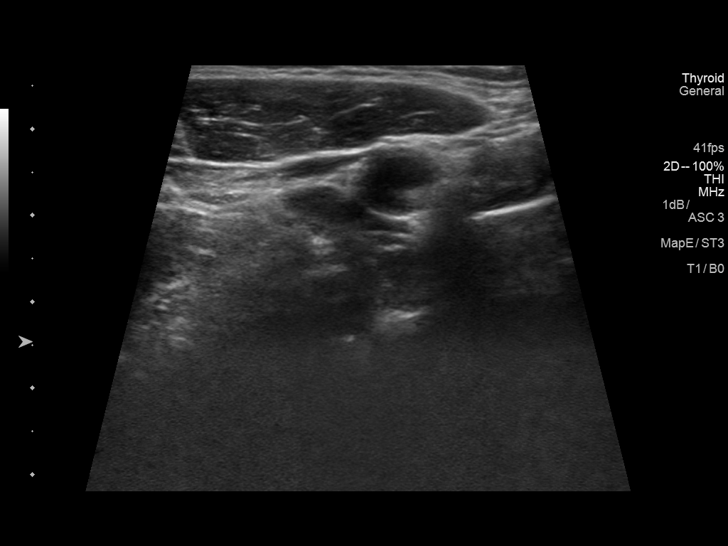
[im 27/91]
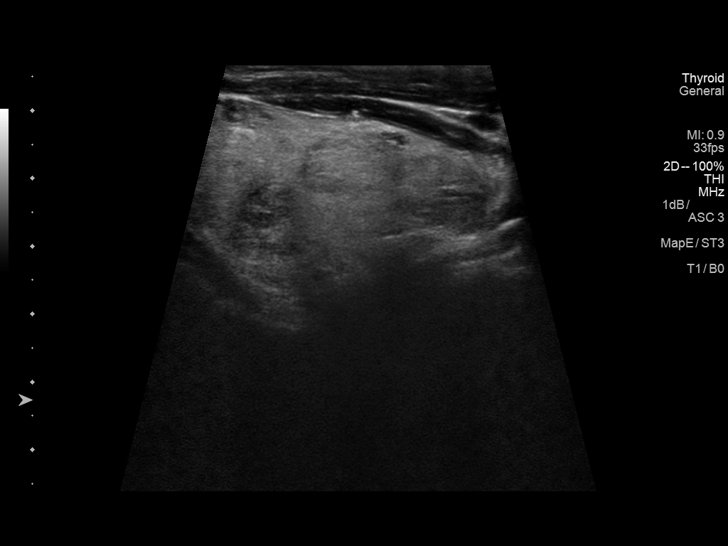
[im 34/91]
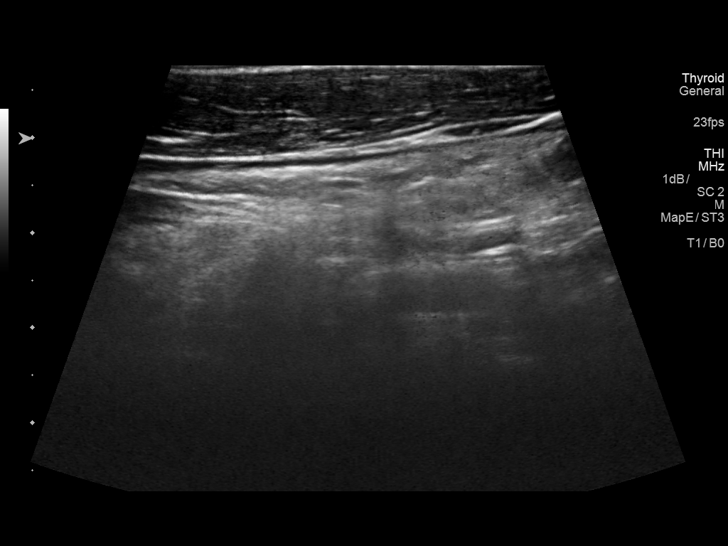
[im 42/91]
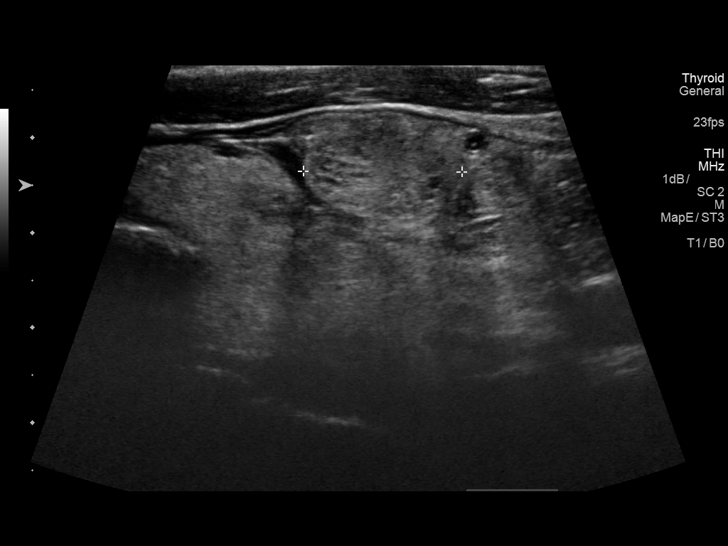
[im 49/91]
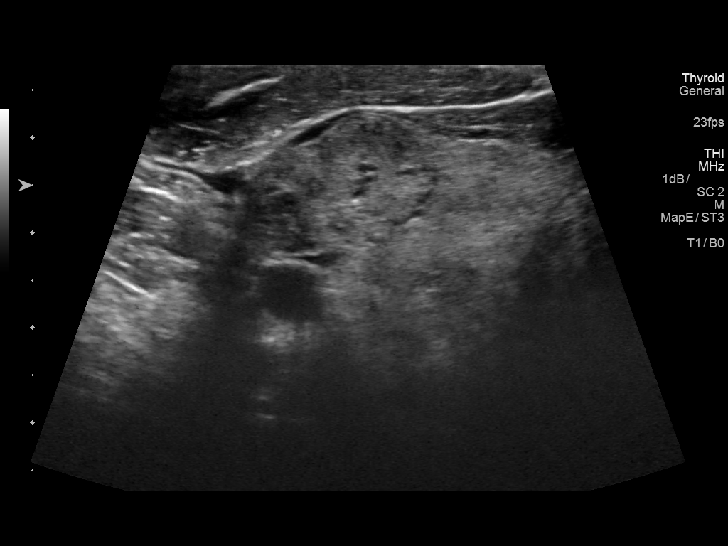
[im 57/91]
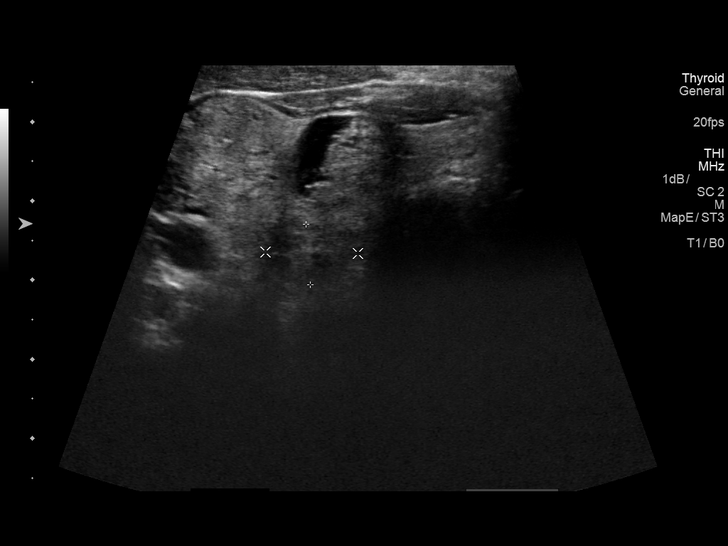
[im 64/91]
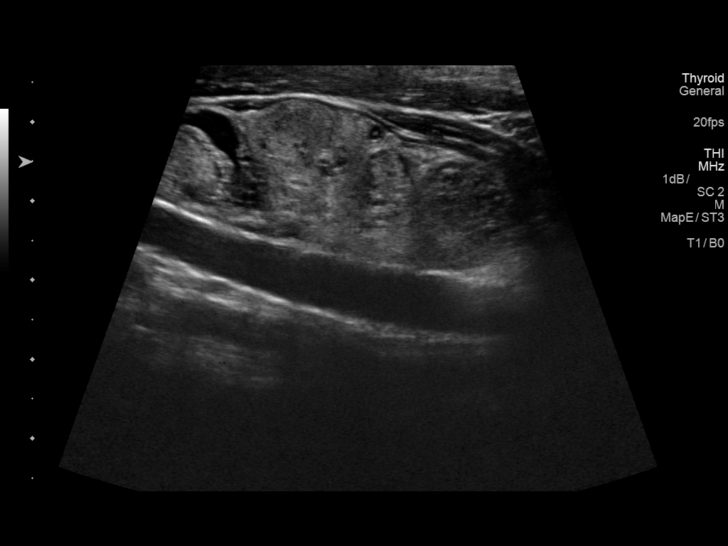
[im 72/91]
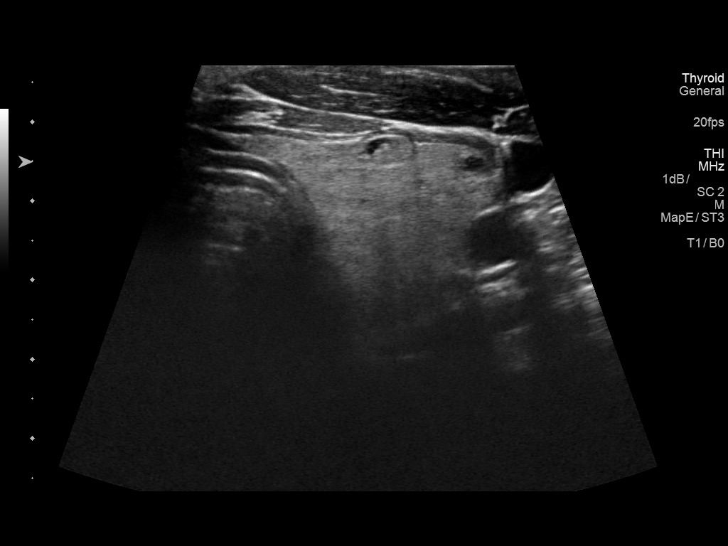
[im 79/91]
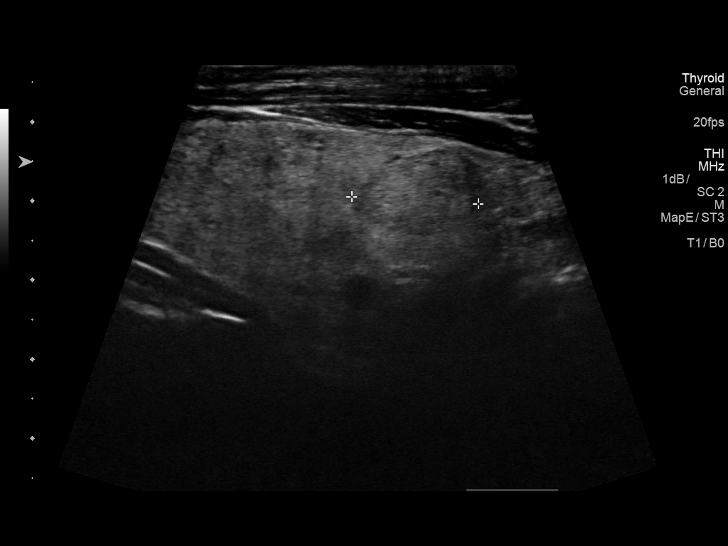
[im 87/91]
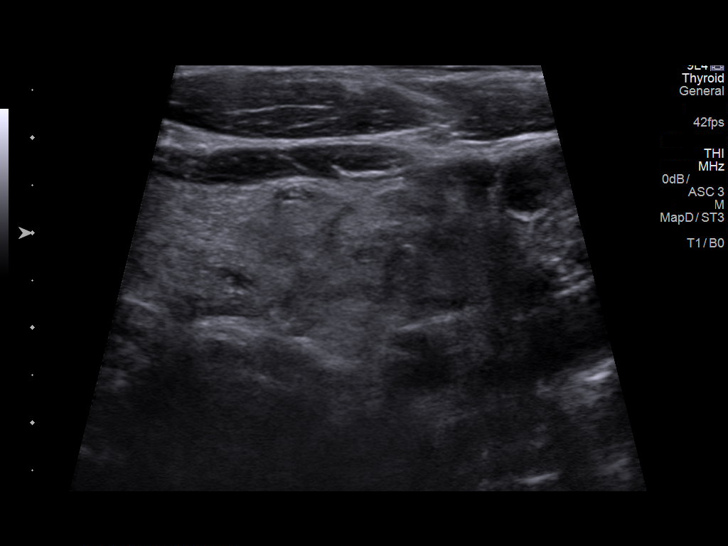

[12 of 25 positions shown; findings below may reference images not displayed]

FINDINGS: Parenchymal Echotexture: Moderately heterogenous

Isthmus: 0.6 cm, previously 0.9 cm

Right lobe: 7.4 x 2.8 x 3.1 cm, previously 8.7 x 3.3 x 3.1 cm

Left lobe: 7.0 x 2.9 x 2.6 cm, previously 7.4 x 2.9 x 2.8 cm

_________________________________________________________

Estimated total number of nodules >/= 1 cm: 6-10

Number of spongiform nodules >/=  2 cm not described below (TR1): 0

Number of mixed cystic and solid nodules >/= 1.5 cm not described
below (TR2): 0

_________________________________________________________

Nodule # 2:

Prior biopsy: No

Location: Right; Superior

Maximum size: 1.7 cm; Other 2 dimensions: 1.4 x 1.3 cm, previously,
1.1 x 1.1 x 1.1 cm

Composition: solid/almost completely solid (2)

Echogenicity: isoechoic (1)

Shape: not taller-than-wide (0)

Margins: smooth (0)

Echogenic foci: none (0)

ACR TI-RADS total points: 3.

ACR TI-RADS risk category:  TR3 (3 points).

Significant change in size (>/= 20% in two dimensions and minimal
increase of 2 mm): Yes

Change in features: No

Change in ACR TI-RADS risk category: No

ACR TI-RADS recommendations:

*Given size (>/= 1.5 - 2.4 cm) and appearance, a follow-up
ultrasound in 1 year should be considered based on TI-RADS criteria.

_________________________________________________________

Nodule # 3:

Prior biopsy: No

Location: Right; Mid

Maximum size: 2.2 cm; Other 2 dimensions: 1.9 x 1.3 cm, previously,
2.0 x 1.6 x 1.2 cm

Composition: mixed cystic and solid (1)

Echogenicity: isoechoic (1)

Shape: not taller-than-wide (0)

Margins: smooth (0)

Echogenic foci: none (0)

ACR TI-RADS total points: 2.

ACR TI-RADS risk category:  TR2 (2 points).

Significant change in size (>/= 20% in two dimensions and minimal
increase of 2 mm): No

Change in features: Yes

Change in ACR TI-RADS risk category: Yes

ACR TI-RADS recommendations:

This nodule does NOT meet TI-RADS criteria for biopsy or dedicated
follow-up.

_________________________________________________________

Nodule # 5:

Prior biopsy: No

Location: Right; Inferior

Maximum size: 1.7 cm; Other 2 dimensions: 1.4 x 1.2 cm, previously,
1.7 x 1.2 x 1.6 cm

Composition: solid/almost completely solid (2)

Echogenicity: isoechoic (1)

Shape: not taller-than-wide (0)

Margins: smooth (0)

Echogenic foci: none (0)

ACR TI-RADS total points: 3.

ACR TI-RADS risk category:  TR3 (3 points).

Significant change in size (>/= 20% in two dimensions and minimal
increase of 2 mm): No

Change in features: No

Change in ACR TI-RADS risk category: No

ACR TI-RADS recommendations:

*Given size (>/= 1.5 - 2.4 cm) and appearance, a follow-up
ultrasound in 1 year should be considered based on TI-RADS criteria.

_________________________________________________________

Nodule # 9:

Prior biopsy: No

Location: Left; Inferior

Maximum size: 1.6 cm; Other 2 dimensions: 1.2 x 1.0 cm, previously,
1.6 x 1.3 x 1.1 cm

Composition: solid/almost completely solid (2)

Echogenicity: isoechoic (1)

Shape: not taller-than-wide (0)

Margins: smooth (0)

Echogenic foci: none (0)

ACR TI-RADS total points: 3.

ACR TI-RADS risk category:  TR3 (3 points).

Significant change in size (>/= 20% in two dimensions and minimal
increase of 2 mm): No

Change in features: No

Change in ACR TI-RADS risk category: No

ACR TI-RADS recommendations:

*Given size (>/= 1.5 - 2.4 cm) and appearance, a follow-up
ultrasound in 1 year should be considered based on TI-RADS criteria.

_________________________________________________________

Nodule 1 in the right upper pole measures 1.8 x 1.8 x 1.3 cm and
previously measured 2.4 x 2.0 x 1.8 cm. Biopsy was performed
06/05/2017.

Left mid nodule 8 measures 1.1 x 1.1 x 0.9 cm and previously
measured 1.5 x 1.3 x 1.0 cm. Biopsy was performed 06/05/2017.
IMPRESSION: Nodule 2 is larger and meets criteria for annual follow-up.

Nodule 3 does not meet criteria for biopsy nor follow-up

Nodule 5 is stable and meets criteria for annual follow-up.

Nodule 9 is stable and meets criteria for annual follow-up

Nodules 1 and 8 previously underwent biopsy. See above for
measurements.

The above is in keeping with the ACR TI-RADS recommendations - [HOSPITAL] 0110;[DATE].

## 2020-03-03 ENCOUNTER — Other Ambulatory Visit: Payer: Self-pay

## 2020-03-03 ENCOUNTER — Emergency Department (INDEPENDENT_AMBULATORY_CARE_PROVIDER_SITE_OTHER)
Admission: RE | Admit: 2020-03-03 | Discharge: 2020-03-03 | Disposition: A | Discharge status: Home / Self Care | Payer: 59 | Source: Ambulatory Visit

## 2020-03-03 VITALS — BP 124/79 | HR 77 | Temp 98.6°F | Resp 16

## 2020-03-03 DIAGNOSIS — S0502XA Injury of conjunctiva and corneal abrasion without foreign body, left eye, initial encounter: Secondary | ICD-10-CM | POA: Diagnosis not present

## 2020-03-03 MED ORDER — GENTAMICIN SULFATE 0.3 % OP SOLN: 2.0000 [drp] | Freq: Four times a day (QID) | OPHTHALMIC | 0 refills | Status: AC

## 2020-03-03 NOTE — ED Triage Notes (Signed)
Patient presents to Urgent Care with complaints of left eye pain since a bug flew into it while he was walking outside with his wife two nights ago. Patient reports he has tried to catch it in his eye and accidentally squished it into his eyeball reflexively. Has tried to flush it out, has not gotten better.

## 2020-03-03 NOTE — ED Provider Notes (Signed)
[facility]    CSN: [id number] Arrival date & time: 03/03/20  1657      History   Chief Complaint Chief Complaint  Patient presents with  . Appointment    5:00  . Eye Pain    Left    HPI Jeffrey Knox is a 49 y.o. male.   HPI [facility] is a 49 y.o. male presenting to UC with c/o Left eye pain and irritation after a bug flew into his eye two nights ago.  He feels like something is still in his eye despite flushing it at home. Denies change in vision. No discharge.    History reviewed. No pertinent past medical history.  There are no problems to display for this patient.   History reviewed. No pertinent surgical history.     Home Medications    Prior to Admission medications   Medication Sig Start Date End Date Taking? Authorizing Provider  gentamicin (GARAMYCIN) 0.3 % ophthalmic solution Place 2 drops into the left eye 4 (four) times daily for 5 days. 03/03/20 03/08/20  Lurene Shadow, PA-C    Family History Family History  Problem Relation Age of Onset  . Cancer Mother        thyroid  . Asthma Mother   . Healthy Father     Social History Social History   Tobacco Use  . Smoking status: Never Smoker  . Smokeless tobacco: Never Used  Substance Use Topics  . Alcohol use: Not Currently  . Drug use: Not on file     Allergies   Patient has no known allergies.   Review of Systems Review of Systems  HENT: Negative for congestion.   Eyes: Positive for pain and redness. Negative for photophobia, discharge, itching and visual disturbance.  Neurological: Negative for dizziness and headaches.     Physical Exam Triage Vital Signs ED Triage Vitals  Enc Vitals Group     BP 03/03/20 1710 124/79     Pulse Rate 03/03/20 1710 77     Resp 03/03/20 1710 16     Temp 03/03/20 1710 98.6 F (37 C)     Temp Source 03/03/20 1710 Oral     SpO2 03/03/20 1710 99 %     Weight --      Height --      Head Circumference --      Peak Flow --      Pain  Score 03/03/20 1709 1     Pain Loc --      Pain Edu? --      Excl. in GC? --    No data found.  Updated Vital Signs BP 124/79 (BP Location: Right Arm)   Pulse 77   Temp 98.6 F (37 C) (Oral)   Resp 16   SpO2 99%   Visual Acuity Right Eye Distance: 20/40 Left Eye Distance: 20/50 Bilateral Distance: (S) 20/40 (Patient did not bring his prescription glasses)  Right Eye Near:   Left Eye Near:    Bilateral Near:     Physical Exam Vitals and nursing note reviewed.  Constitutional:      Appearance: Normal appearance. He is well-developed.  HENT:     Head: Normocephalic and atraumatic.  Eyes:     General: Lids are normal. Lids are everted, no foreign bodies appreciated. Vision grossly intact. Gaze aligned appropriately.     Extraocular Movements: Extraocular movements intact.     Conjunctiva/sclera:     Left eye: Left conjunctiva is injected (mildly).  Pupils:     Left eye: Corneal abrasion present.   Cardiovascular:     Rate and Rhythm: Normal rate.  Pulmonary:     Effort: Pulmonary effort is normal.  Musculoskeletal:        General: Normal range of motion.     Cervical back: Normal range of motion.  Skin:    General: Skin is warm and dry.  Neurological:     Mental Status: He is alert and oriented to person, place, and time.  Psychiatric:        Behavior: Behavior normal.      UC Treatments / Results  Labs (all labs ordered are listed, but only abnormal results are displayed) Labs Reviewed - No data to display  EKG   Radiology No results found.  Procedures Procedures (including critical care time)  Medications Ordered in UC Medications - No data to display  Initial Impression / Assessment and Plan / UC Course  I have reviewed the triage vital signs and the nursing notes.  Pertinent labs & imaging results that were available during my care of the patient were reviewed by me and considered in my medical decision making (see chart for details).       Corneal abrasion without foreign body noted on exam Will tx with gentamycin ophthalmic drops AVS provided  Final Clinical Impressions(s) / UC Diagnoses   Final diagnoses:  Corneal abrasion, left, initial encounter     Discharge Instructions      Please use the eye drops as prescribed.  Follow up with an eye specialist or your family doctor early next week if not improving.     ED Prescriptions    Medication Sig Dispense Auth. Provider   gentamicin (GARAMYCIN) 0.3 % ophthalmic solution Place 2 drops into the left eye 4 (four) times daily for 5 days. 5 mL Lurene Shadow, PA-C     PDMP not reviewed this encounter.   Lurene Shadow, [place] 03/04/20 1533

## 2020-03-03 NOTE — Discharge Instructions (Signed)
  Please use the eye drops as prescribed.  Follow up with an eye specialist or your family doctor early next week if not improving.

## 2020-07-12 ENCOUNTER — Other Ambulatory Visit: Payer: Self-pay | Admitting: Internal Medicine

## 2020-07-12 DIAGNOSIS — E042 Nontoxic multinodular goiter: Secondary | ICD-10-CM

## 2020-07-27 ENCOUNTER — Other Ambulatory Visit: Payer: 59

## 2020-08-03 ENCOUNTER — Other Ambulatory Visit: Payer: 59

## 2020-08-24 ENCOUNTER — Ambulatory Visit
Admission: RE | Admit: 2020-08-24 | Discharge: 2020-08-24 | Disposition: A | Discharge status: Home / Self Care | Payer: 59 | Source: Ambulatory Visit | Attending: Internal Medicine | Admitting: Internal Medicine

## 2020-08-24 DIAGNOSIS — E042 Nontoxic multinodular goiter: Secondary | ICD-10-CM

## 2020-08-30 ENCOUNTER — Other Ambulatory Visit: Payer: Self-pay | Admitting: Internal Medicine

## 2020-08-30 DIAGNOSIS — E042 Nontoxic multinodular goiter: Secondary | ICD-10-CM

## 2020-09-14 ENCOUNTER — Ambulatory Visit
Admission: RE | Admit: 2020-09-14 | Discharge: 2020-09-14 | Disposition: A | Discharge status: Home / Self Care | Payer: 59 | Source: Ambulatory Visit | Attending: Internal Medicine | Admitting: Internal Medicine

## 2020-09-14 ENCOUNTER — Other Ambulatory Visit (HOSPITAL_COMMUNITY)
Admission: RE | Admit: 2020-09-14 | Discharge: 2020-09-14 | Disposition: A | Discharge status: Home / Self Care | Payer: 59 | Source: Ambulatory Visit | Attending: Student | Admitting: Student

## 2020-09-14 DIAGNOSIS — E042 Nontoxic multinodular goiter: Secondary | ICD-10-CM

## 2020-09-16 LAB — CYTOLOGY - NON PAP

## 2021-09-11 ENCOUNTER — Other Ambulatory Visit: Payer: Self-pay | Admitting: Internal Medicine

## 2021-09-11 DIAGNOSIS — E042 Nontoxic multinodular goiter: Secondary | ICD-10-CM

## 2021-09-21 ENCOUNTER — Ambulatory Visit
Admission: RE | Admit: 2021-09-21 | Discharge: 2021-09-21 | Disposition: A | Discharge status: Home / Self Care | Payer: 59 | Source: Ambulatory Visit | Attending: Internal Medicine | Admitting: Internal Medicine

## 2021-09-21 ENCOUNTER — Other Ambulatory Visit (HOSPITAL_COMMUNITY)
Admission: RE | Admit: 2021-09-21 | Discharge: 2021-09-21 | Disposition: A | Discharge status: Home / Self Care | Payer: 59 | Source: Ambulatory Visit | Attending: Internal Medicine | Admitting: Internal Medicine

## 2021-09-21 DIAGNOSIS — E042 Nontoxic multinodular goiter: Secondary | ICD-10-CM

## 2021-09-22 LAB — CYTOLOGY - NON PAP

## 2022-01-27 IMAGING — US US THYROID
1 series · 12 of 25 positions shown · non-contrast
Comparison: 05/16/2018, 06/05/2017

CLINICAL DATA: Prior ultrasound follow-up.

EXAM:
THYROID ULTRASOUND
TECHNIQUE: Ultrasound examination of the thyroid gland and adjacent soft
tissues was performed.

[Series 1: us thyroid · 0.06mm/px · 12 of 53 slices shown]
[im 3/53]
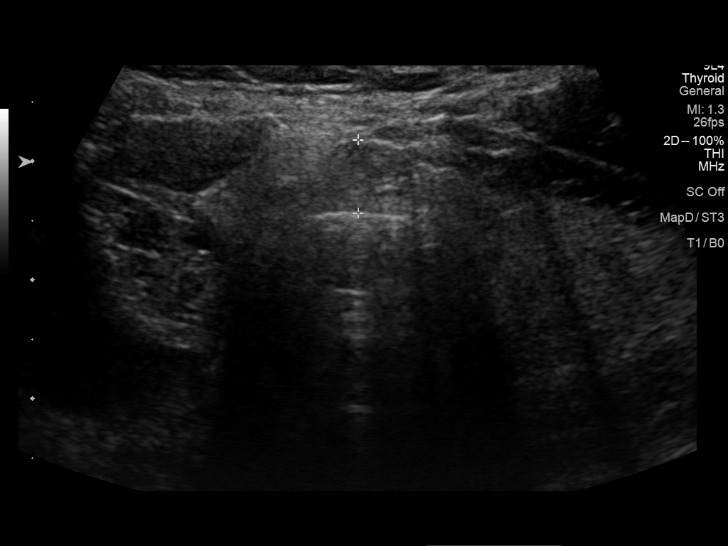
[im 7/53]
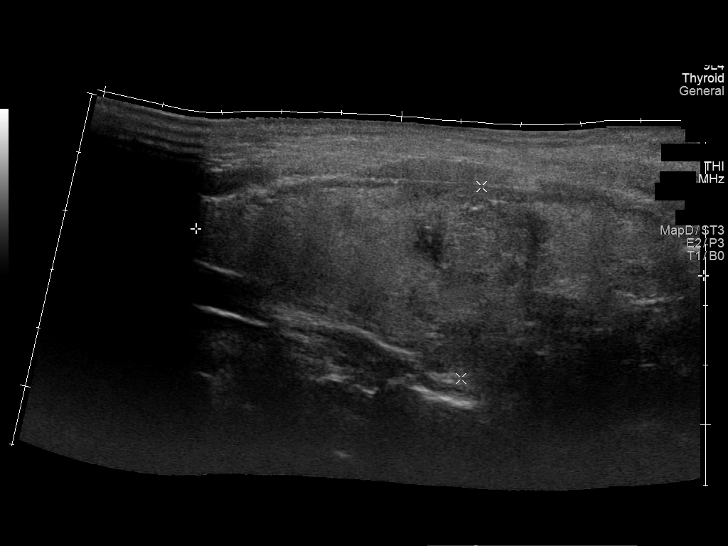
[im 11/53]
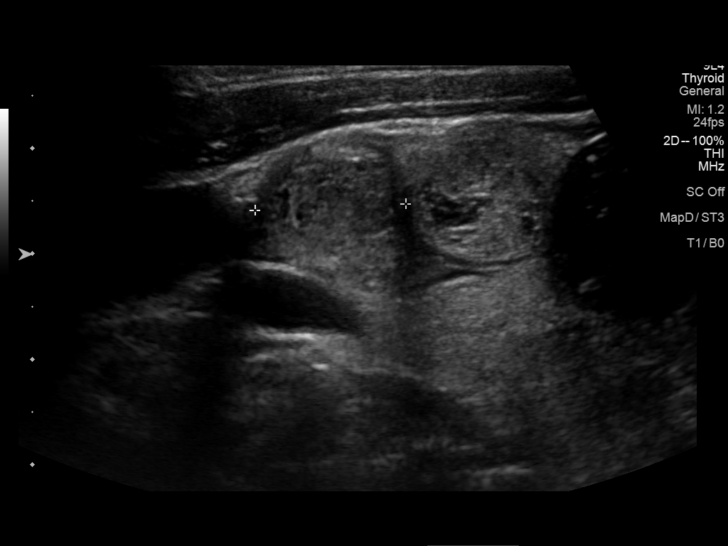
[im 16/53]
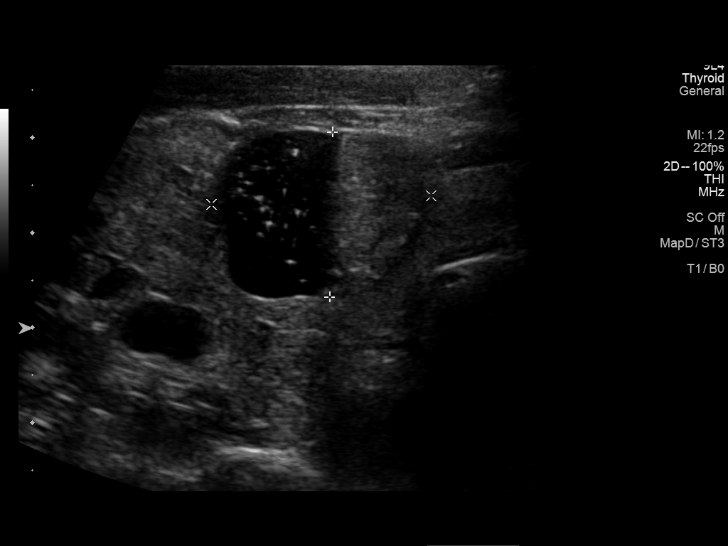
[im 20/53]
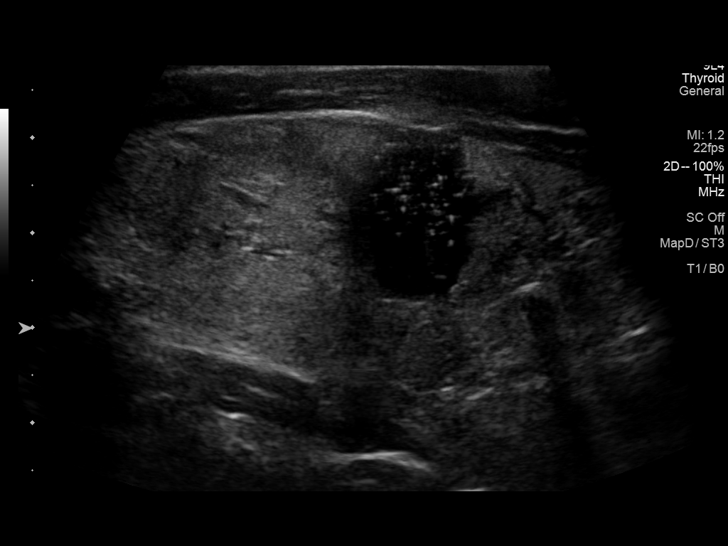
[im 24/53]
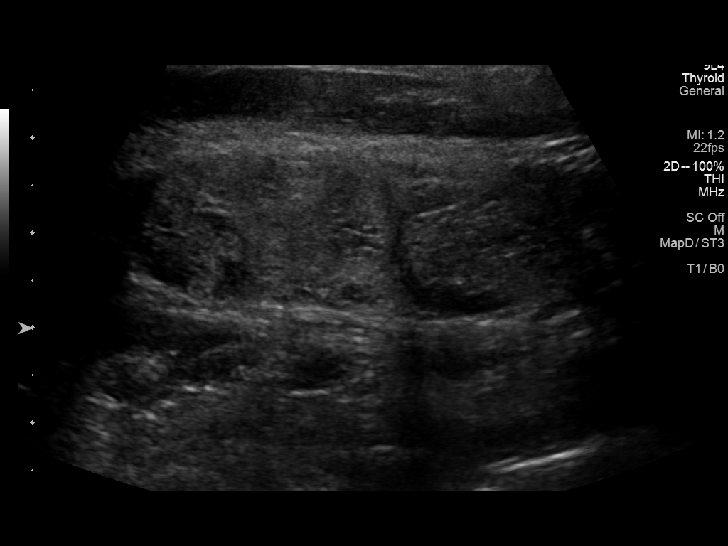
[im 29/53]
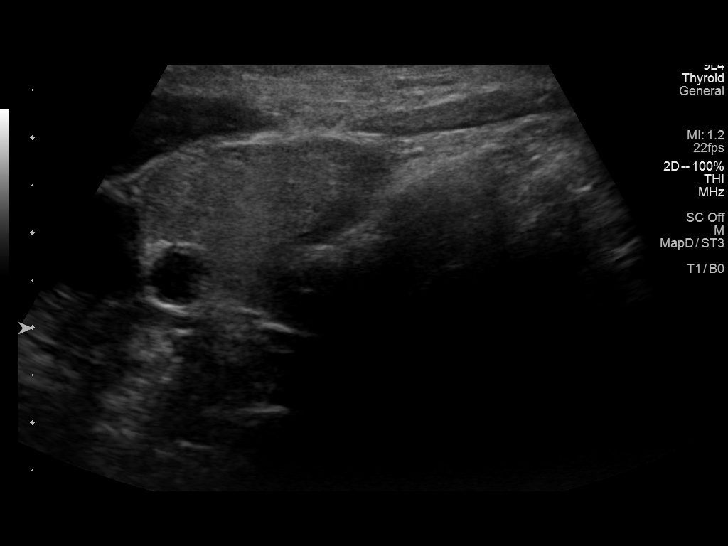
[im 33/53]
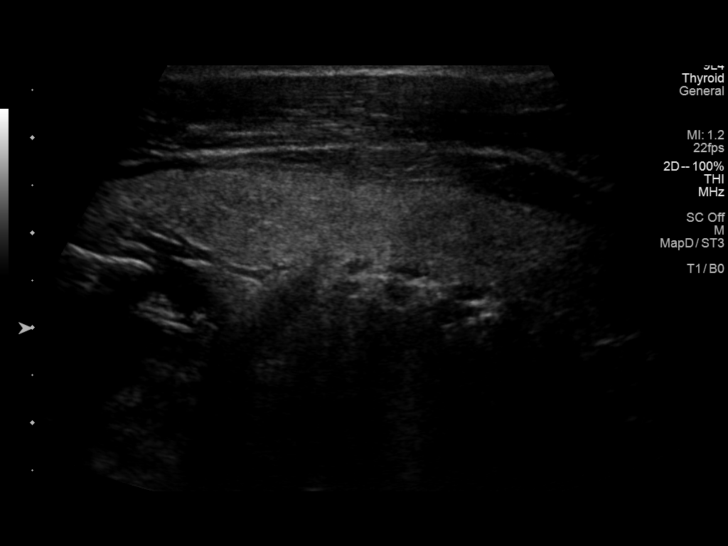
[im 37/53]
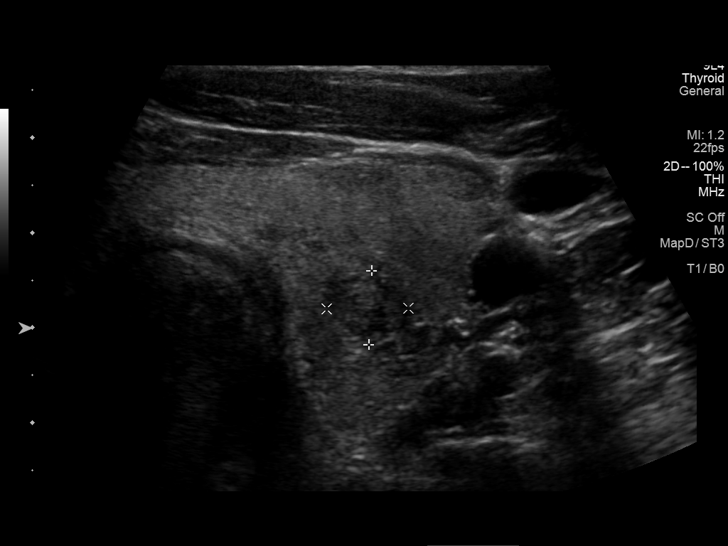
[im 42/53]
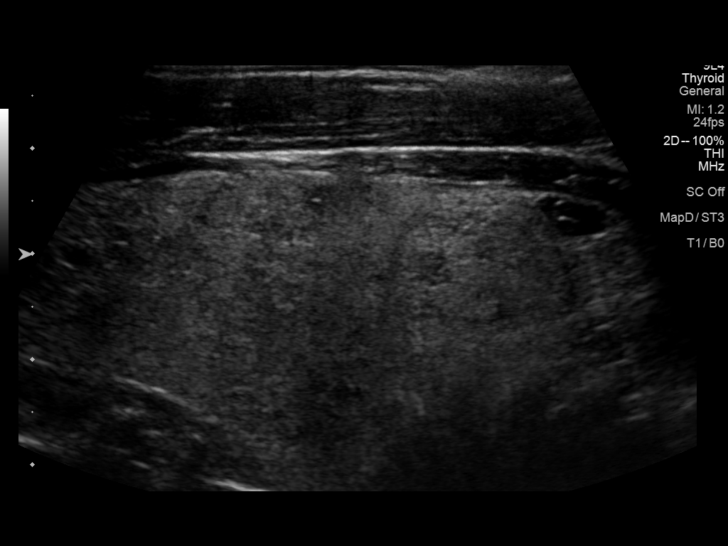
[im 46/53]
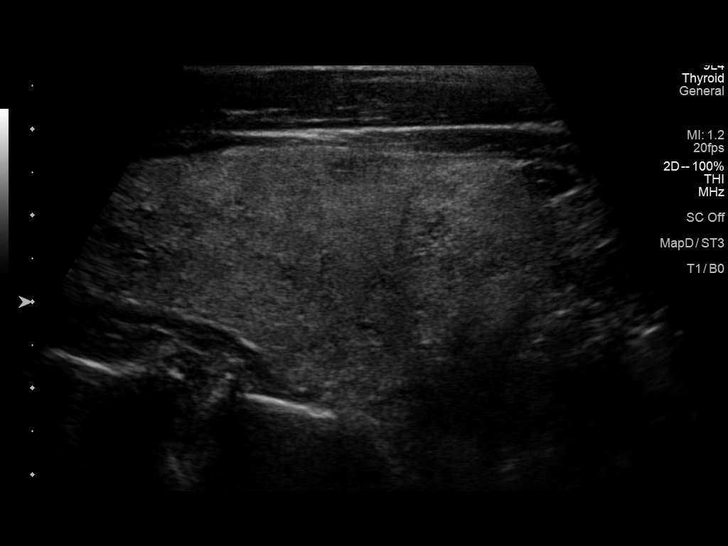
[im 50/53]
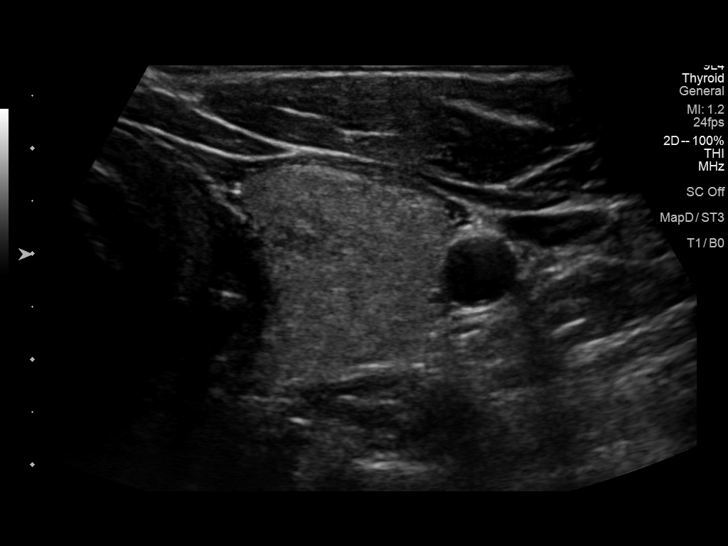

[12 of 25 positions shown; findings below may reference images not displayed]

FINDINGS: Parenchymal Echotexture: Moderately heterogenous

Isthmus: 0.6 cm, previously 0.6 cm

Right lobe: 8.5 x 3.2 x 3.6 cm, previously 7.4 x 2.8 x 3.1 cm

Left lobe: 7.5 x 2.7 x 2.4 cm, previously 7.0 x 2.9 x 2.6 cm

_________________________________________________________

Estimated total number of nodules >/= 1 cm: 6-10

Number of spongiform nodules >/=  2 cm not described below (TR1): 0

Number of mixed cystic and solid nodules >/= 1.5 cm not described
below (TR2): 0

_________________________________________________________

The previously biopsied right superior nodule (labeled 1) measures
up to 1.7 cm, previously 1.8 cm. Recommend correlation with prior
biopsy results.

Nodule # 2:

Prior biopsy: No

Location: Right; Superior

Maximum size: 1.7 cm; Other 2 dimensions: 1.5 x 1.5 cm, previously,
1.7 x 1.4 x 1.3 cm

Composition: solid/almost completely solid (2)

Echogenicity: isoechoic (1)

Shape: not taller-than-wide (0)

Margins: ill-defined (0)

Echogenic foci: none (0)

ACR TI-RADS total points: 3.

ACR TI-RADS risk category:  TR3 (3 points).

Significant change in size (>/= 20% in two dimensions and minimal
increase of 2 mm): No

Change in features: No

Change in ACR TI-RADS risk category: No

ACR TI-RADS recommendations:

*Given size (>/= 1.5 - 2.4 cm) and appearance, a follow-up
ultrasound in 1 year should be considered based on TI-RADS criteria.

_________________________________________________________

Similar appearance of the benign appearing mixed cystic and solid
nodule (labeled 3) in the right mid thyroid measuring up to 2.8 cm,
previously 2.2 cm.

Similar appearance of the benign-appearing solid isoechoic solid
nodule (labeled 4) in the inferior right thyroid measuring up to
cm, previously 1.7 cm.

Nodule # 5:

Location: Left; Mid

Maximum size: 1.0 cm; Other 2 dimensions: 0.9 x 0.8 cm

Composition: solid/almost completely solid (2)

Echogenicity: hypoechoic (2)

Shape: not taller-than-wide (0)

Margins: ill-defined (0)

Echogenic foci: none (0)

ACR TI-RADS total points: 4.

ACR TI-RADS risk category: TR4 (4-6 points).

ACR TI-RADS recommendations:

*Given size (>/= 1 - 1.4 cm) and appearance, a follow-up ultrasound
in 1 year should be considered based on TI-RADS criteria.

_________________________________________________________

Similar appearance of previously biopsied left inferior thyroid
nodule (labeled 6, previously labeled 8) measuring up to 1.5 cm,
previously 1.1 cm. Recommend correlation with prior biopsy results.

Nodule # 7 (previously labeled 9):

Prior biopsy: No

Location: Left; Inferior

Maximum size: 2.8 cm; Other 2 dimensions: 1.7 x 1.3 cm, previously,
1.6 x 1.2 x 1.0 cm

Composition: solid/almost completely solid (2)

Echogenicity: isoechoic (1)

Shape: not taller-than-wide (0)

Margins: ill-defined (0)

Echogenic foci: none (0)

ACR TI-RADS total points: 3.

ACR TI-RADS risk category:  TR3 (3 points).

Significant change in size (>/= 20% in two dimensions and minimal
increase of 2 mm): Yes

Change in features: No

Change in ACR TI-RADS risk category: No

ACR TI-RADS recommendations:

**Given size (>/= 2.5 cm) and appearance, fine needle aspiration of
this mildly suspicious nodule should be considered based on TI-RADS
criteria.

_________________________________________________________
IMPRESSION: 1. Similar appearing multinodular goiter.
2. Interval enlargement of left inferior thyroid nodule (labeled 7,
2.8 cm, previously labeled 9, 1.6 cm) which meets criteria (TI-RADS
category 3) for tissue sampling. Recommend ultrasound guided fine
needle aspiration.
3. Interval development of a left mid solid thyroid nodule (labeled
5, 1.0 cm) which meets criteria (TI-RADS category 4) for 1 year
ultrasound surveillance.
4. Similar appearance of right superior solid thyroid nodule
(labeled 2, 1.7 cm) which meets criteria (TI-RADS category 3) for 1
year ultrasound surveillance. This study marks 4 years stability.
Unchanged appearance of the additional previously biopsied and
benign appearing thyroid nodules.

The above is in keeping with the ACR TI-RADS recommendations - [HOSPITAL] 9066;[DATE].

## 2022-06-29 ENCOUNTER — Other Ambulatory Visit: Payer: Self-pay | Admitting: Internal Medicine

## 2022-06-29 DIAGNOSIS — E042 Nontoxic multinodular goiter: Secondary | ICD-10-CM

## 2022-08-29 ENCOUNTER — Ambulatory Visit
Admission: RE | Admit: 2022-08-29 | Discharge: 2022-08-29 | Disposition: A | Discharge status: Home / Self Care | Payer: 59 | Source: Ambulatory Visit | Attending: Internal Medicine | Admitting: Internal Medicine

## 2022-08-29 DIAGNOSIS — E042 Nontoxic multinodular goiter: Secondary | ICD-10-CM

## 2022-09-12 ENCOUNTER — Other Ambulatory Visit: Payer: Self-pay | Admitting: Internal Medicine

## 2022-09-12 DIAGNOSIS — E042 Nontoxic multinodular goiter: Secondary | ICD-10-CM

## 2022-10-03 ENCOUNTER — Other Ambulatory Visit (HOSPITAL_COMMUNITY)
Admission: RE | Admit: 2022-10-03 | Discharge: 2022-10-03 | Disposition: A | Discharge status: Home / Self Care | Payer: 59 | Source: Ambulatory Visit | Attending: Physician Assistant | Admitting: Physician Assistant

## 2022-10-03 ENCOUNTER — Ambulatory Visit
Admission: RE | Admit: 2022-10-03 | Discharge: 2022-10-03 | Disposition: A | Discharge status: Home / Self Care | Payer: 59 | Source: Ambulatory Visit | Attending: Internal Medicine | Admitting: Internal Medicine

## 2022-10-03 DIAGNOSIS — E041 Nontoxic single thyroid nodule: Secondary | ICD-10-CM

## 2022-10-03 DIAGNOSIS — E042 Nontoxic multinodular goiter: Secondary | ICD-10-CM

## 2022-10-08 LAB — CYTOLOGY - NON PAP

## 2023-07-01 ENCOUNTER — Other Ambulatory Visit: Payer: Self-pay | Admitting: Internal Medicine

## 2023-07-01 DIAGNOSIS — E042 Nontoxic multinodular goiter: Secondary | ICD-10-CM

## 2023-07-16 ENCOUNTER — Ambulatory Visit
Admission: RE | Admit: 2023-07-16 | Discharge: 2023-07-16 | Disposition: A | Discharge status: Home / Self Care | Payer: 59 | Source: Ambulatory Visit | Attending: Internal Medicine | Admitting: Internal Medicine

## 2023-07-16 DIAGNOSIS — E042 Nontoxic multinodular goiter: Secondary | ICD-10-CM

## 2023-09-28 ENCOUNTER — Emergency Department (HOSPITAL_BASED_OUTPATIENT_CLINIC_OR_DEPARTMENT_OTHER)
Admission: EM | Admit: 2023-09-28 | Discharge: 2023-09-28 | Disposition: A | Discharge status: Home / Self Care | Payer: 59 | Attending: Emergency Medicine | Admitting: Emergency Medicine

## 2023-09-28 ENCOUNTER — Emergency Department (HOSPITAL_BASED_OUTPATIENT_CLINIC_OR_DEPARTMENT_OTHER): Payer: 59 | Admitting: Radiology

## 2023-09-28 ENCOUNTER — Encounter (HOSPITAL_BASED_OUTPATIENT_CLINIC_OR_DEPARTMENT_OTHER): Payer: Self-pay

## 2023-09-28 DIAGNOSIS — R0789 Other chest pain: Secondary | ICD-10-CM | POA: Insufficient documentation

## 2023-09-28 LAB — BASIC METABOLIC PANEL
Anion gap: 8 (ref 5–15)
BUN: 15 mg/dL (ref 6–20)
CO2: 25 mmol/L (ref 22–32)
Calcium: 9.2 mg/dL (ref 8.9–10.3)
Chloride: 107 mmol/L (ref 98–111)
Creatinine, Ser: 0.85 mg/dL (ref 0.61–1.24)
GFR, Estimated: >60 mL/min (ref >60)
Glucose, Bld: 108 mg/dL — ABNORMAL HIGH (ref 70–99)
Potassium: 4 mmol/L (ref 3.5–5.1)
Sodium: 140 mmol/L (ref 135–145)

## 2023-09-28 LAB — CBC
HCT: 47.3 % (ref 39.0–52.0)
Hemoglobin: 16.2 g/dL (ref 13.0–17.0)
MCH: 29.8 pg (ref 26.0–34.0)
MCHC: 34.2 g/dL (ref 30.0–36.0)
MCV: 87.1 fL (ref 80.0–100.0)
Platelets: 220 10*3/uL (ref 150–400)
RBC: 5.43 MIL/uL (ref 4.22–5.81)
RDW: 12 % (ref 11.5–15.5)
WBC: 4.5 10*3/uL (ref 4.0–10.5)
nRBC: 0 % (ref 0.0–0.2)

## 2023-09-28 LAB — TROPONIN I (HIGH SENSITIVITY)
Troponin I (High Sensitivity): 4 ng/L (ref <18)
Troponin I (High Sensitivity): 3 ng/L (ref <18)

## 2023-09-28 NOTE — ED Provider Notes (Signed)
  EMERGENCY DEPARTMENT AT [facility] Provider Note   CSN: [id number] Arrival date & time: 09/28/23  4098     History  Chief Complaint  Patient presents with   Chest Pain    Jeffrey Knox is a 53 y.o. male.  HPI    53 year old male comes with a chief complaint of chest tightness.  Patient states that he has been having intermittent left-sided chest pressure/tightness over the last 4 days.  He gets about 8-10 episodes throughout the day, each lasting for several minutes or more than an hour.  There is no specific evoking factor, aggravating or relieving factor.  Discomfort is nonradiating.  There is no associated shortness of breath, nausea, diaphoresis and it has not affected patient's ability to exert himself.  Patient works out every day without any specific discomfort.  Pt has no hx of PE, DVT and denies any exogenous hormone (testosterone / estrogen) use, long distance travels or surgery in the past 6 weeks, active cancer, recent immobilization.  Patient denies any premature CAD in the family.  He denies any smoking, drinking.  Symptoms are not typical for GERD.  No previous history of similar symptoms.  Currently patient has no URI, fevers, cough.  Home Medications Prior to Admission medications   Not on File      Allergies    Patient has no known allergies.    Review of Systems   Review of Systems  All other systems reviewed and are negative.   Physical Exam Updated Vital Signs BP (!) 131/96 (BP Location: Right Arm)   Pulse 60   Temp 98 F (36.7 C) (Oral)   Resp 20   SpO2 97%  Physical Exam Vitals and nursing note reviewed.  Constitutional:      Appearance: He is well-developed.  HENT:     Head: Atraumatic.  Cardiovascular:     Rate and Rhythm: Normal rate.     Heart sounds: Normal heart sounds.  Pulmonary:     Effort: Pulmonary effort is normal.     Breath sounds: Normal breath sounds.  Musculoskeletal:     Cervical back: Neck supple.   Skin:    General: Skin is warm.  Neurological:     Mental Status: He is alert and oriented to person, place, and time.     ED Results / Procedures / Treatments   Labs (all labs ordered are listed, but only abnormal results are displayed) Labs Reviewed  BASIC METABOLIC PANEL - Abnormal; Notable for the following components:      Result Value   Glucose, Bld 108 (*)    All other components within normal limits  CBC  TROPONIN I (HIGH SENSITIVITY)  TROPONIN I (HIGH SENSITIVITY)    EKG EKG Interpretation Date/Time:  Saturday September 28 2023 07:49:13 EST Ventricular Rate:  63 PR Interval:  155 QRS Duration:  94 QT Interval:  421 QTC Calculation: 431 R Axis:   122  Text Interpretation: Sinus rhythm Right axis deviation No acute changes No old tracing to compare Nonspecific ST and T wave abnormality Confirmed by Derwood Kaplan [id number]) on 09/28/2023 7:57:52 AM  Radiology DG Chest 2 View Result Date: 09/28/2023 CLINICAL DATA:  Chest pain. EXAM: CHEST - 2 VIEW COMPARISON:  None Available. FINDINGS: Bilateral lung fields are clear. Bilateral costophrenic angles are clear. Normal cardio-mediastinal silhouette. No acute osseous abnormalities. The soft tissues are within normal limits. IMPRESSION: No active cardiopulmonary disease. Electronically Signed   By: Jules Schick M.D.   On: 09/28/2023  08:04    Procedures Procedures    Medications Ordered in ED Medications - No data to display  ED Course/ Medical Decision Making/ A&P             HEART Score: 1                    Medical Decision Making Amount and/or Complexity of Data Reviewed Labs: ordered. Radiology: ordered.   This patient presents to the ED with chief complaint(s) of chest pressure/tightness on the left side, intermittent with pertinent past medical history of no metabolic syndrome or cardiovascular history.The complaint involves an extensive differential diagnosis and also carries with it a high risk of  complications and morbidity.    The differential diagnosis considered for this patient includes  ACS syndrome Aortic dissection Pericarditis Endocarditis Pericardial effusion / tamponade Pneumonia PE Pneumothorax Musculoskeletal pain PUD / Gastritis / Esophagitis Esophageal spasm  The initial plan is to get basic labs.  The pain is atypical.  Patient's past medical history, family history, social history are all reassuring.  Hear score is 1 for age.  Patient is PERC negative.  Patient does not have specific cardiac, musculoskeletal, GI or pleuritic component to it.  Independent labs interpretation:  The following labs were independently interpreted: Troponin x 2 reassuring.  Independent visualization and interpretation of imaging: - I independently visualized the following imaging with scope of interpretation limited to determining acute life threatening conditions related to emergency care: X-ray of the chest, which revealed no evidence of pneumothorax or pneumonia.   Patient is EKG is reassuring.  Cardiac enzymes x 2 are normal and reassuring.  X-ray is fine.  Chest pain is atypical.   I think we best for patient to follow-up with PCP.  The patient appears reasonably screened and/or stabilized for discharge and I doubt any other medical condition or other [facility] requiring further screening, evaluation, or treatment in the ED at this time prior to discharge.   Results from the ER workup discussed with the patient face to face and all questions answered to the best of my ability. The patient is safe for discharge with strict return precautions.  Final Clinical Impression(s) / ED Diagnoses Final diagnoses:  Left chest pressure    Rx / DC Orders ED Discharge Orders     None         Derwood Kaplan, MD 09/28/23 1119

## 2023-09-28 NOTE — ED Notes (Signed)
 Dc instructions reviewed with patient. Patient voiced understanding. Dc with belongings.

## 2023-09-28 NOTE — ED Triage Notes (Signed)
 He c/o occasional, self-limiting episodes of left upper chest "pressure". He tells me he "work out daily on an elliptical and when I do work out it does not cause or increase the discomfort". He denies fever, nor any other sign of current illness. His wife is with him.

## 2023-09-28 NOTE — Discharge Instructions (Signed)
 We saw you in the ER for the chest pain/shortness of breath. All of our cardiac workup is normal, including labs, EKG and chest X-RAY are normal. We are not sure what is causing your discomfort, but we feel comfortable sending you home at this time. The workup in the ER is not complete, and you should follow up with your primary care doctor for further evaluation.  Please return to the ER if you have worsening chest pain, shortness of breath, pain radiating to your jaw, shoulder, or back, sweats or fainting. Otherwise see the Cardiologist or your primary care doctor as requested.

## 2024-01-29 ENCOUNTER — Encounter: Payer: Self-pay | Admitting: Cardiovascular Disease

## 2024-01-29 ENCOUNTER — Ambulatory Visit: Attending: Cardiovascular Disease | Admitting: Cardiovascular Disease

## 2024-01-29 VITALS — BP 130/82 | HR 58 | Ht 70.0 in | Wt 184.0 lb

## 2024-01-29 DIAGNOSIS — E785 Hyperlipidemia, unspecified: Secondary | ICD-10-CM | POA: Insufficient documentation

## 2024-01-29 DIAGNOSIS — R0789 Other chest pain: Secondary | ICD-10-CM | POA: Diagnosis not present

## 2024-01-29 DIAGNOSIS — E782 Mixed hyperlipidemia: Secondary | ICD-10-CM | POA: Diagnosis not present

## 2024-01-29 NOTE — Assessment & Plan Note (Signed)
 Patient was referred by his PCP, Dr. Aisha Ali, for atypical chest pain.  He was seen in the ER 09/28/2023 with prolonged chest pain.  This lasted for several days.  His workup is unrevealing including normal EKG and negative troponins.  He now thinks it may be that been related to stress at work.  He has had no subsequent episodes.  His only risk factor is untreated hyperlipidemia.  I am going to get a coronary calcium score to her stratify.

## 2024-01-29 NOTE — Progress Notes (Signed)
 01/29/2024 Marten Skipper   1970-11-17  [id number]  Primary Physician Wyn Heater, MD Primary Cardiologist: Avanell Leigh MD Bennye Bravo, [place]  HPI:  Jeffrey Knox is a 53 y.o. thin-appearing married Caucasian male father of 6 children who works as an Art gallery manager at [employer].  He was referred by his PCP, Dr. Wyn Heater, with atypical chest pain.  His only risk factor is untreated hyperlipidemia.  There is no family history of heart disease.  Never had heart attack or stroke.  He was seen in the ER in February for chest pain which lasted for days at a time.  Workup was unrevealing.  He said no subsequent episodes.  He did mention that he felt that the episodes was related to a work-related stress.   Current Meds  Medication Sig   cetirizine (ZYRTEC) 10 MG tablet Take 10 mg by mouth daily.   fluticasone (FLONASE) 50 MCG/ACT nasal spray Place 1 spray into both nostrils daily.   Multiple Vitamin (MULTI-VITAMIN) tablet Take 1 tablet by mouth every morning.     No Known Allergies  Social History   Socioeconomic History   Marital status: Married    Spouse name: Not on file   Number of children: Not on file   Years of education: Not on file   Highest education level: Not on file  Occupational History   Not on file  Tobacco Use   Smoking status: Never   Smokeless tobacco: Never  Substance and Sexual Activity   Alcohol use: Not Currently   Drug use: Not on file   Sexual activity: Not on file  Other Topics Concern   Not on file  Social History Narrative   Not on file   Social Drivers of Health   Financial Resource Strain: Not on file  Food Insecurity: Not on file  Transportation Needs: Not on file  Physical Activity: Not on file  Stress: Not on file  Social Connections: Unknown (12/23/2021)   [facility]   Social Network    Social Network: Not on file  Intimate Partner Violence: Unknown (11/15/2021)   Received from [facility]   HITS     Physically Hurt: Not on file    Insult or Talk Down To: Not on file    Threaten Physical Harm: Not on file    Scream or Curse: Not on file     Review of Systems: General: negative for chills, fever, night sweats or weight changes.  Cardiovascular: negative for chest pain, dyspnea on exertion, edema, orthopnea, palpitations, paroxysmal nocturnal dyspnea or shortness of breath Dermatological: negative for rash Respiratory: negative for cough or wheezing Urologic: negative for hematuria Abdominal: negative for nausea, vomiting, diarrhea, bright red blood per rectum, melena, or hematemesis Neurologic: negative for visual changes, syncope, or dizziness All other systems reviewed and are otherwise negative except as noted above.    Blood pressure 130/82, pulse (!) 58, height 5' 10 (1.778 m), weight 184 lb (83.5 kg), SpO2 96%.  General appearance: alert and no distress Neck: no adenopathy, no carotid bruit, no JVD, supple, symmetrical, trachea midline, and thyroid  not enlarged, symmetric, no tenderness/mass/nodules Lungs: clear to auscultation bilaterally Heart: regular rate and rhythm, S1, S2 normal, no murmur, click, rub or gallop Extremities: extremities normal, atraumatic, no cyanosis or edema Pulses: 2+ and symmetric Skin: Skin color, texture, turgor normal. No rashes or lesions Neurologic: Grossly normal  EKG not performed today      ASSESSMENT AND PLAN:  Atypical chest pain Patient was referred by his PCP, Dr. Aisha Ali, for atypical chest pain.  He was seen in the ER 09/28/2023 with prolonged chest pain.  This lasted for several days.  His workup is unrevealing including normal EKG and negative troponins.  He now thinks it may be that been related to stress at work.  He has had no subsequent episodes.  His only risk factor is untreated hyperlipidemia.  I am going to get a coronary calcium score to her stratify.  Hyperlipidemia Recent lipid profile performed 10/31/2023 revealed  total cholesterol 209, LDL 153 and HDL 43.  I am going to get a coronary calcium score to her stratify.  I suspect ultimately he will need to be on a statin drug.     Avanell Leigh MD FACP,FACC,FAHA, [facility] 01/29/2024 3:18 PM

## 2024-01-29 NOTE — Patient Instructions (Signed)
 Medication Instructions:  Your physician recommends that you continue on your current medications as directed. Please refer to the Current Medication list given to you today.  *If you need a refill on your cardiac medications before your next appointment, please call your pharmacy*   Testing/Procedures: Dr. Katheryne Pane has ordered a CT coronary calcium score.   Test locations:  [facility]  [facility]  This is $99 out of pocket.  Please call [phone] to schedule.   Coronary CalciumScan A coronary calcium scan is an imaging test used to look for deposits of calcium and other fatty materials (plaques) in the inner lining of the blood vessels of the heart (coronary arteries). These deposits of calcium and plaques can partly clog and narrow the coronary arteries without producing any symptoms or warning signs. This puts a person at risk for a heart attack. This test can detect these deposits before symptoms develop. Tell a health care provider about: Any allergies you have. All medicines you are taking, including vitamins, herbs, eye drops, creams, and over-the-counter medicines. Any problems you or family members have had with anesthetic medicines. Any blood disorders you have. Any surgeries you have had. Any medical conditions you have. Whether you are pregnant or may be pregnant. What are the risks? Generally, this is a safe procedure. However, problems may occur, including: Harm to a pregnant woman and her unborn baby. This test involves the use of radiation. Radiation exposure can be dangerous to a pregnant woman and her unborn baby. If you are pregnant, you generally should not have this procedure done. Slight increase in the risk of cancer. This is because of the radiation involved in the test. What happens before the procedure? No preparation is needed for this procedure. What happens  during the procedure? You will undress and remove any jewelry around your neck or chest. You will put on a hospital gown. Sticky electrodes will be placed on your chest. The electrodes will be connected to an electrocardiogram (ECG) machine to record a tracing of the electrical activity of your heart. A CT scanner will take pictures of your heart. During this time, you will be asked to lie still and hold your breath for 2-3 seconds while a picture of your heart is being taken. The procedure may vary among health care providers and hospitals. What happens after the procedure? You can get dressed. You can return to your normal activities. It is up to you to get the results of your test. Ask your health care provider, or the department that is doing the test, when your results will be ready. Summary A coronary calcium scan is an imaging test used to look for deposits of calcium and other fatty materials (plaques) in the inner lining of the blood vessels of the heart (coronary arteries). Generally, this is a safe procedure. Tell your health care provider if you are pregnant or may be pregnant. No preparation is needed for this procedure. A CT scanner will take pictures of your heart. You can return to your normal activities after the scan is done. This information is not intended to replace advice given to you by your health care provider. Make sure you discuss any questions you have with your health care provider. Document Released: 01/26/2008 Document Revised: 06/18/2016 Document Reviewed: 06/18/2016 Elsevier Interactive Patient Education  2017 [employer].   Follow-Up: At [facility], you and your health needs are our priority.  As part of our continuing mission to provide you with exceptional heart care, our providers are all part of one team.  This team includes your primary Cardiologist (physician) and Advanced Practice Providers or APPs (Physician Assistants and Nurse  Practitioners) who all work together to provide you with the care you need, when you need it.  Your next appointment:   We will see you on an as needed basis  Provider:   Lauro Portal, MD    We recommend signing up for the patient portal called MyChart.  Sign up information is provided on this After Visit Summary.  MyChart is used to connect with patients for Virtual Visits (Telemedicine).  Patients are able to view lab/test results, encounter notes, upcoming appointments, etc.  Non-urgent messages can be sent to your provider as well.   To learn more about what you can do with MyChart, go to [email or web address].

## 2024-01-29 NOTE — Assessment & Plan Note (Signed)
 Recent lipid profile performed 10/31/2023 revealed total cholesterol 209, LDL 153 and HDL 43.  I am going to get a coronary calcium score to her stratify.  I suspect ultimately he will need to be on a statin drug.

## 2024-02-19 ENCOUNTER — Ambulatory Visit (HOSPITAL_BASED_OUTPATIENT_CLINIC_OR_DEPARTMENT_OTHER)
Admission: RE | Admit: 2024-02-19 | Discharge: 2024-02-19 | Disposition: A | Discharge status: Home / Self Care | Payer: Self-pay | Source: Ambulatory Visit | Attending: Cardiovascular Disease | Admitting: Cardiovascular Disease

## 2024-02-19 DIAGNOSIS — E782 Mixed hyperlipidemia: Secondary | ICD-10-CM | POA: Insufficient documentation

## 2024-02-19 DIAGNOSIS — R0789 Other chest pain: Secondary | ICD-10-CM | POA: Insufficient documentation

## 2024-02-24 ENCOUNTER — Ambulatory Visit: Payer: Self-pay | Admitting: Cardiovascular Disease

## 2024-07-08 ENCOUNTER — Other Ambulatory Visit: Payer: Self-pay | Admitting: Internal Medicine

## 2024-07-08 DIAGNOSIS — E042 Nontoxic multinodular goiter: Secondary | ICD-10-CM

## 2024-07-23 ENCOUNTER — Other Ambulatory Visit

## 2024-07-30 ENCOUNTER — Inpatient Hospital Stay: Admission: RE | Admit: 2024-07-30 | Discharge: 2024-07-30 | Attending: Internal Medicine | Admitting: Internal Medicine

## 2024-07-30 DIAGNOSIS — E042 Nontoxic multinodular goiter: Secondary | ICD-10-CM

## 2024-08-10 ENCOUNTER — Other Ambulatory Visit: Payer: Self-pay | Admitting: Internal Medicine

## 2024-08-10 DIAGNOSIS — E042 Nontoxic multinodular goiter: Secondary | ICD-10-CM

## 2024-09-16 ENCOUNTER — Other Ambulatory Visit (HOSPITAL_COMMUNITY): Admission: RE | Admit: 2024-09-16 | Source: Ambulatory Visit

## 2024-09-16 ENCOUNTER — Ambulatory Visit
Admission: RE | Admit: 2024-09-16 | Discharge: 2024-09-16 | Disposition: A | Source: Ambulatory Visit | Attending: Internal Medicine | Admitting: Internal Medicine

## 2024-09-16 DIAGNOSIS — E042 Nontoxic multinodular goiter: Secondary | ICD-10-CM

## 2024-09-16 NOTE — Procedures (Signed)
 Pre procedural Dx: Thyroid  nodule  Post procedural Dx: Same  Technically successful US  guided biopsy of thyroid  nodule.  Previously biopsied, but increasing in size left lateral inferior lobe nodule.   EBL: None.   Complications: None immediate.   KANDICE Banner, MD Pager #: [phone]

## 2024-09-17 LAB — CYTOLOGY - NON PAP
